# Patient Record
Sex: Male | Born: 1960 | Race: White | Hispanic: No | Marital: Married | Smoking: Never smoker
Health system: Southern US, Community
[De-identification: ages and names within clinical notes are randomized; demographics above are authoritative.]

## PROBLEM LIST (undated history)

## (undated) DIAGNOSIS — K219 Gastro-esophageal reflux disease without esophagitis: Secondary | ICD-10-CM

## (undated) DIAGNOSIS — J302 Other seasonal allergic rhinitis: Secondary | ICD-10-CM

## (undated) DIAGNOSIS — IMO0001 Reserved for inherently not codable concepts without codable children: Secondary | ICD-10-CM

## (undated) DIAGNOSIS — I10 Essential (primary) hypertension: Secondary | ICD-10-CM

## (undated) DIAGNOSIS — Z86010 Personal history of colonic polyps: Secondary | ICD-10-CM

## (undated) HISTORY — DX: Reserved for inherently not codable concepts without codable children: IMO0001

## (undated) HISTORY — PX: TONSILLECTOMY: SUR1361

## (undated) HISTORY — DX: Essential (primary) hypertension: I10

## (undated) HISTORY — DX: Gastro-esophageal reflux disease without esophagitis: K21.9

---

## 2009-04-29 DIAGNOSIS — Z860100 Personal history of colon polyps, unspecified: Secondary | ICD-10-CM

## 2009-04-29 HISTORY — DX: Personal history of colon polyps, unspecified: Z86.0100

## 2009-04-29 HISTORY — DX: Personal history of colonic polyps: Z86.010

## 2009-08-24 ENCOUNTER — Encounter: Payer: Self-pay | Admitting: Internal Medicine

## 2009-09-18 ENCOUNTER — Ambulatory Visit (HOSPITAL_COMMUNITY): Admission: RE | Admit: 2009-09-18 | Discharge: 2009-09-18 | Payer: Self-pay | Admitting: Internal Medicine

## 2009-09-18 ENCOUNTER — Ambulatory Visit: Payer: Self-pay | Admitting: Internal Medicine

## 2009-09-20 ENCOUNTER — Encounter: Payer: Self-pay | Admitting: Internal Medicine

## 2010-05-29 NOTE — Letter (Signed)
Summary: Patient Notice, Colon Biopsy Results  [facility]  [address]   [place], [place] [address]   Phone: [phone]  Fax: [phone]       Sep 20, 2009   Raymond Rosales [address] [place], [place]  [address] Dec 10, 1960    Dear [name],  I am pleased to inform you that the biopsies taken during your recent colonoscopy did not show any evidence of cancer upon pathologic examination.  Additional information/recommendations:  No further action is needed at this time.  Please follow-up with your primary care physician for your other healthcare needs.  You should have a repeat colonoscopy examination  in 3 years.  Please call us if you are having persistent problems or have questions about your condition that have not been fully answered at this time.  Sincerely,    R. Roetta Sessions MD, FACP [facility] Ph: [phone]    Fax: [phone]   Appended Document: Patient Notice, Colon Biopsy Results mailed letter to pt  Appended Document: Patient Notice, Colon Biopsy Results reminder in computer

## 2010-05-29 NOTE — Letter (Signed)
Summary: Internal Other Domingo Dimes  Internal Other Domingo Dimes   Imported By: Cloria Spring LPN 47/42/5956 38:75:64  _____________________________________________________________________  External Attachment:    Type:   Image     Comment:   External Document

## 2012-05-07 ENCOUNTER — Other Ambulatory Visit: Payer: Self-pay

## 2012-05-07 ENCOUNTER — Telehealth: Payer: Self-pay | Admitting: Internal Medicine

## 2012-05-07 DIAGNOSIS — Z139 Encounter for screening, unspecified: Secondary | ICD-10-CM

## 2012-05-07 NOTE — Telephone Encounter (Signed)
Pt came to office window this morning saying that he was due for his TCS and Dr Gerda Diss told him to get with [place]. I told patient that the recall list has him due in June 2014 and since he had polyps at the last tcs that we would need for him to make OV first and then schedule procedure. Patient does not feel it is necessary for an OV and he would like to set up procedure from triage. Please advise. [phone]

## 2012-05-07 NOTE — Telephone Encounter (Signed)
I called and spoke to the patient. He had his annual physical this morning and was just following up on his next colonoscopy, which is due 09/18/2012. ( Hx of tubulovillous adenoma). He would like to schedule in May but would like to avoid an office visit.  I told him I would ask Dr. Jena Gauss and let him know. He said he is not having any problems and he would definitely call if he had any problems before then. Please advise if he can just be triaged at that time.

## 2012-05-07 NOTE — Telephone Encounter (Signed)
I will go along with this patient to be triaged

## 2012-05-07 NOTE — Telephone Encounter (Signed)
LMOM at pt's cell  [phone], that Dr. Jena Gauss said he can just be triaged and I will have him on my call list to call in April and schedule for May as he requested.

## 2012-09-01 ENCOUNTER — Telehealth: Payer: Self-pay

## 2012-09-02 NOTE — Telephone Encounter (Signed)
Gastroenterology Pre-Procedure Form  THIS ONE DR. Jena Gauss HAD PREVIOUSLY GIVEN PERMISSION TO TRIAGE ( HX OF ADENOMATOUS POLYP) LAST 09/18/2009    Request Date: 09/02/2012   Requesting Physician: Time for next one (PCP is Dr. Lilyan Punt )     PATIENT INFORMATION:  Raymond Rosales is a 52 y.o., male (DOB=10/19/1960).  PROCEDURE: Procedure(s) requested: colonoscopy Procedure Reason: screening for colon cancer (Hx of adenomatous polyp )  PATIENT REVIEW QUESTIONS: The patient reports the following:   1. Diabetes Melitis: no 2. Joint replacements in the past 12 months: no 3. Major health problems in the past 3 months: no 4. Has an artificial valve or MVP:no 5. Has been advised in past to take antibiotics in advance of a procedure like teeth cleaning: no}    MEDICATIONS & ALLERGIES:    Patient reports the following regarding taking any blood thinners:   Plavix? no Aspirin?no Coumadin?  no  Patient confirms/reports the following medications:  Current Outpatient Prescriptions  Medication Sig Dispense Refill  . cetirizine HCl (ZYRTEC) 5 MG/5ML SYRP Take by mouth daily. He takes a dose of the liquid as needed, most every day ( he gives his daughter the liquid and has that on hand)      . Multiple Vitamin (MULTIVITAMIN) tablet Take 1 tablet by mouth daily.       No current facility-administered medications for this visit.    Patient confirms/reports the following allergies:  Allergies  Allergen Reactions  . Penicillins Rash    Patient is appropriate to schedule for requested procedure(s): yes  AUTHORIZATION INFORMATION Primary Insurance:   ID #:   Group #:  Pre-Cert / Auth required:  Pre-Cert / Auth #:   Secondary Insurance:   ID #:   Group #:  Pre-Cert / Auth required:  Pre-Cert / Auth #:   No orders of the defined types were placed in this encounter.    SCHEDULE INFORMATION: Procedure has been scheduled as follows:  Date: 09/22/2012   Time: 7:30 AM  Location: [facility] Short Stay  This Gastroenterology Pre-Precedure Form is being routed to the following provider(s) for review: R. Roetta Sessions, MD

## 2012-09-02 NOTE — Telephone Encounter (Signed)
OK to schedule

## 2012-09-08 MED ORDER — PEG-KCL-NACL-NASULF-NA ASC-C 100 G PO SOLR: 1.0000 | ORAL | Status: DC

## 2012-09-08 NOTE — Telephone Encounter (Signed)
Rx sent to the pharmacy and instructions mailed to the pt.  

## 2012-09-09 ENCOUNTER — Encounter (HOSPITAL_COMMUNITY): Payer: Self-pay | Admitting: Pharmacy Technician

## 2012-09-22 ENCOUNTER — Ambulatory Visit (HOSPITAL_COMMUNITY)
Admission: RE | Admit: 2012-09-22 | Discharge: 2012-09-22 | Disposition: A | Discharge status: Home / Self Care | Payer: BC Managed Care – PPO | Source: Ambulatory Visit | Attending: Internal Medicine | Admitting: Internal Medicine

## 2012-09-22 ENCOUNTER — Encounter (HOSPITAL_COMMUNITY): Payer: Self-pay | Admitting: *Deleted

## 2012-09-22 ENCOUNTER — Encounter (HOSPITAL_COMMUNITY)
Admission: RE | Disposition: A | Discharge status: Home / Self Care | Payer: Self-pay | Source: Ambulatory Visit | Attending: Internal Medicine

## 2012-09-22 DIAGNOSIS — K5730 Diverticulosis of large intestine without perforation or abscess without bleeding: Secondary | ICD-10-CM

## 2012-09-22 DIAGNOSIS — Z860100 Personal history of colon polyps, unspecified: Secondary | ICD-10-CM | POA: Insufficient documentation

## 2012-09-22 DIAGNOSIS — Z09 Encounter for follow-up examination after completed treatment for conditions other than malignant neoplasm: Secondary | ICD-10-CM | POA: Insufficient documentation

## 2012-09-22 DIAGNOSIS — Z1211 Encounter for screening for malignant neoplasm of colon: Secondary | ICD-10-CM

## 2012-09-22 HISTORY — DX: Other seasonal allergic rhinitis: J30.2

## 2012-09-22 HISTORY — PX: COLONOSCOPY: SHX5424

## 2012-09-22 HISTORY — DX: Personal history of colonic polyps: Z86.010

## 2012-09-22 SURGERY — COLONOSCOPY
Anesthesia: Moderate Sedation

## 2012-09-22 MED ORDER — MEPERIDINE HCL 100 MG/ML IJ SOLN
INTRAMUSCULAR | Status: DC | PRN
  Administered 2012-09-22 (×2): 50 mg via INTRAVENOUS

## 2012-09-22 MED ORDER — MIDAZOLAM HCL 5 MG/5ML IJ SOLN
INTRAMUSCULAR | Status: AC
  Filled 2012-09-22: qty 10

## 2012-09-22 MED ORDER — ONDANSETRON HCL 4 MG/2ML IJ SOLN
INTRAMUSCULAR | Status: AC
  Filled 2012-09-22: qty 2

## 2012-09-22 MED ORDER — MIDAZOLAM HCL 5 MG/5ML IJ SOLN
INTRAMUSCULAR | Status: DC | PRN
  Administered 2012-09-22: 2 mg via INTRAVENOUS
  Administered 2012-09-22: 1 mg via INTRAVENOUS
  Administered 2012-09-22: 2 mg via INTRAVENOUS

## 2012-09-22 MED ORDER — MEPERIDINE HCL 100 MG/ML IJ SOLN
INTRAMUSCULAR | Status: AC
  Filled 2012-09-22: qty 1

## 2012-09-22 MED ORDER — SODIUM CHLORIDE 0.9 % IV SOLN
INTRAVENOUS | Status: DC
  Administered 2012-09-22: 1000 mL via INTRAVENOUS

## 2012-09-22 MED ORDER — STERILE WATER FOR IRRIGATION IR SOLN
Status: DC | PRN
  Administered 2012-09-22: 08:00:00

## 2012-09-22 NOTE — H&P (Signed)
  Primary Care Physician:  Dr. Lilyan Punt Primary Gastroenterologist:  Dr. Jena Gauss  Pre-Procedure History & Physical: HPI:  Raymond Rosales is a 52 y.o. male here for here for surveillance colonoscopy. 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   No bowel symptoms currently  Past Medical History  Diagnosis Date  . Seasonal allergies   . History of colon polyps 2011    Past Surgical History  Procedure Laterality Date  . Tonsillectomy      52  yrs of age    Prior to Admission medications   Medication Sig Start Date End Date Taking? Authorizing Provider  cetirizine HCl (ZYRTEC) 5 MG/5ML SYRP Take 5 mg by mouth daily.   Yes Historical Provider, MD  Multiple Vitamin (MULTIVITAMIN) tablet Take 1 tablet by mouth daily.   Yes Historical Provider, MD  peg 3350 powder (MOVIPREP) 100 G SOLR Take 1 kit (100 g total) by mouth as directed. 09/08/12  Yes Corbin Ade, MD    Allergies as of 05/07/2012  . (Not on File)    Family History  Problem Relation Age of Onset  . CAD Father   . Heart attack Father   . Diabetes type II Father     History   Social History  . Marital Status: Married    Spouse Name: N/A    Number of Children: N/A  . Years of Education: N/A   Occupational History  . Not on file.   Social History Main Topics  . Smoking status: Never Smoker   . Smokeless tobacco: Not on file  . Alcohol Use: Yes     Comment: occasional social  . Drug Use: No  . Sexually Active: Yes   Other Topics Concern  . Not on file   Social History Narrative  . No narrative on file    Review of Systems: See HPI, otherwise negative ROS  Physical Exam: BP 139/87  Pulse 63  Temp(Src) 97.7 F (36.5 C) (Oral)  Resp 18  Ht 5\' 9"  (1.753 m)  Wt 200 lb (90.719 kg)  BMI 29.52 kg/m2  SpO2 98% General:   Alert,  Well-developed, well-nourished, pleasant and cooperative in NAD Skin:  Intact without significant lesions or rashes. Eyes:  Sclera clear, no icterus.    Conjunctiva pink. Ears:  Normal auditory acuity. Nose:  No deformity, discharge,  or lesions. Mouth:  No deformity or lesions. Neck:  Supple; no masses or thyromegaly. No significant cervical adenopathy. Lungs:  Clear throughout to auscultation.   No wheezes, crackles, or rhonchi. No acute distress. Heart:  Regular rate and rhythm; no murmurs, clicks, rubs,  or gallops. Abdomen: Non-distended, normal bowel sounds.  Soft and nontender without appreciable mass or hepatosplenomegaly.  Pulses:  Normal pulses noted. Extremities:  Without clubbing or edema.  Impression/Plan:  52 year old gentleman with a history of tubulovillous adenoma. Here for surveillance examination.  The risks, benefits, limitations, alternatives and imponderables have been reviewed with the patient. Questions have been answered. All parties are agreeable.

## 2012-09-22 NOTE — Op Note (Signed)
[facility] [address] [place], [address]   COLONOSCOPY PROCEDURE REPORT  PATIENT: Raymond Rosales, Raymond Rosales  MR#:         [id number] BIRTHDATE: 11-Nov-1960 , 52  yrs. old GENDER: Male ENDOSCOPIST: R.  Roetta Sessions, MD FACP FACG REFERRED BY:  Lilyan Punt, M.D. PROCEDURE DATE:  09/22/2012 PROCEDURE:     Surveillance colonoscopy  INDICATIONS: History of a tubulovillous adenoma removed from ascending colon 3 years ago  INFORMED CONSENT:  The risks, benefits, alternatives and imponderables including but not limited to bleeding, perforation as well as the possibility of a missed lesion have been reviewed.  The potential for biopsy, lesion removal, etc. have also been discussed.  Questions have been answered.  All parties agreeable. Please see the history and physical in the medical record for more information.  MEDICATIONS: Versed 6 mg IV And Demerol 100 mg IV in divided doses. Zofran 4 mg IV  DESCRIPTION OF PROCEDURE:  After a digital rectal exam was performed, the EC-[id number] ([id number])  colonoscope was advanced from the anus through the rectum and colon to the area of the cecum, ileocecal valve and appendiceal orifice.  The cecum was deeply intubated.  These structures were well-seen and photographed for the record.  From the level of the cecum and ileocecal valve, the scope was slowly and cautiously withdrawn.  The mucosal surfaces were carefully surveyed utilizing scope tip deflection to facilitate fold flattening as needed.  The scope was pulled down into the rectum where a thorough examination including retroflexion was performed.    FINDINGS:  Adequate preparation. Normal rectum. Few,scattered left-sided diverticula; the remainder of the colonic mucosa appeared normal.  THERAPEUTIC / DIAGNOSTIC MANEUVERS PERFORMED:  None  COMPLICATIONS: None  CECAL WITHDRAWAL TIME:  11 minutes  IMPRESSION:  Colonic diverticulosis  RECOMMENDATIONS: Repeat colonoscopy  in 5 years   _______________________________ eSigned:  R. Roetta Sessions, MD FACP [facility] 09/22/2012 8:24 AM   CC:

## 2012-09-23 ENCOUNTER — Encounter (HOSPITAL_COMMUNITY): Payer: Self-pay | Admitting: Internal Medicine

## 2012-10-21 ENCOUNTER — Ambulatory Visit (INDEPENDENT_AMBULATORY_CARE_PROVIDER_SITE_OTHER): Payer: BC Managed Care – PPO | Admitting: Family Medicine

## 2012-10-21 ENCOUNTER — Encounter: Payer: Self-pay | Admitting: Family Medicine

## 2012-10-21 VITALS — BP 140/80 | Temp 98.5°F | Ht 68.5 in | Wt 210.2 lb

## 2012-10-21 DIAGNOSIS — I10 Essential (primary) hypertension: Secondary | ICD-10-CM | POA: Insufficient documentation

## 2012-10-21 NOTE — Progress Notes (Signed)
  Subjective:    Patient ID: Raymond Rosales, male    DOB: December 28, 1960, 52 y.o.   MRN: [id number]  Hypertension This is a new problem. The current episode started in the past 7 days. The problem is unchanged. The problem is uncontrolled. Associated symptoms include headaches. (Jaw tingling on left side only ) There are no associated agents to hypertension. There are no known risk factors for coronary artery disease. Past treatments include nothing. The current treatment provides no improvement. There are no compliance problems.    No prior history of blood pressure troubles her is some family history of heart disease and diabetes Patient had a little bit of numbness on the left side of his jaw but this only lasted a few minutes and went away he states he's had this intermittently for a long time  Review of Systems  Neurological: Positive for headaches.   Blood pressure was checked his machine versus our wall unit. The lower number is accurate the top number of his machine over reads it.    Objective:   Physical Exam Lungs are clear no crackles respiratory rate normal Heart regular no murmurs Extremities no edema skin warm dry Blood pressure best reading 138/94       Assessment & Plan:  HTN-I. encourage patient to do the best he can exercise watch diet use DASH diet and lose 20 pounds. Also encourage patient to followup in 3 months time we'll see a blood pressure is doing he will check his blood pressure no greater than twice a week he will notify us if any particular problems. I find no evidence of stroke I do recommend cholesterol profile metabolic 7 before next visit in 3 months

## 2012-10-21 NOTE — Patient Instructions (Signed)
Dash diet 1800 Calorie Diet for Diabetes Meal Planning The 1800 calorie diet is designed for eating up to 1800 calories each day. Following this diet and making healthy meal choices can help improve overall health. This diet controls blood sugar (glucose) levels and can also help lower blood pressure and cholesterol. SERVING SIZES Measuring foods and serving sizes helps to make sure you are getting the right amount of food. The list below tells how big or small some common serving sizes are:  1 oz.........4 stacked dice.  3 oz........Marland KitchenDeck of cards.  1 tsp.......Marland KitchenTip of little finger.  1 tbs......Marland KitchenMarland KitchenThumb.  2 tbs.......Marland KitchenGolf ball.   cup......Marland KitchenHalf of a fist.  1 cup.......Marland KitchenA fist. GUIDELINES FOR CHOOSING FOODS The goal of this diet is to eat a variety of foods and limit calories to 1800 each day. This can be done by choosing foods that are low in calories and fat. The diet also suggests eating small amounts of food frequently. Doing this helps control your blood glucose levels so they do not get too high or too low. Each meal or snack may include a protein food source to help you feel more satisfied and to stabilize your blood glucose. Try to eat about the same amount of food around the same time each day. This includes weekend days, travel days, and days off work. Space your meals about 4 to 5 hours apart and add a snack between them if you wish.  For example, a daily food plan could include breakfast, a morning snack, lunch, dinner, and an evening snack. Healthy meals and snacks include whole grains, vegetables, fruits, lean meats, poultry, fish, and dairy products. As you plan your meals, select a variety of foods. Choose from the bread and starch, vegetable, fruit, dairy, and meat/protein groups. Examples of foods from each group and their suggested serving sizes are listed below. Use measuring cups and spoons to become familiar with what a healthy portion looks like. Bread and Starch Each  serving equals 15 grams of carbohydrates.  1 slice bread.   bagel.   cup cold cereal (unsweetened).   cup hot cereal or mashed potatoes.  1 small potato (size of a computer mouse).   cup cooked pasta or rice.   English muffin.  1 cup broth-based soup.  3 cups of popcorn.  4 to 6 whole-wheat crackers.   cup cooked beans, peas, or corn. Vegetable Each serving equals 5 grams of carbohydrates.   cup cooked vegetables.  1 cup raw vegetables.   cup tomato or vegetable juice. Fruit Each serving equals 15 grams of carbohydrates.  1 small apple or orange.  1 cup watermelon or strawberries.   cup applesauce (no sugar added).  2 tbs raisins.   banana.   cup canned fruit, packed in water, its own juice, or sweetened with a sugar substitute.   cup unsweetened fruit juice. Dairy Each serving equals 12 to 15 grams of carbohydrates.  1 cup fat-free milk.  6 oz artificially sweetened yogurt or plain yogurt.  1 cup low-fat buttermilk.  1 cup soy milk.  1 cup almond milk. Meat/Protein  1 large egg.  2 to 3 oz meat, poultry, or fish.   cup low-fat cottage cheese.  1 tbs peanut butter.  1 oz low-fat cheese.   cup tuna in water.   cup tofu. Fat  1 tsp oil.  1 tsp trans-fat-free margarine.  1 tsp butter.  1 tsp mayonnaise.  2 tbs avocado.  1 tbs salad dressing.  1 tbs  cream cheese.  2 tbs sour cream. SAMPLE 1800 CALORIE DIET PLAN Breakfast   cup unsweetened cereal (1 carb serving).  1 cup fat-free milk (1 carb serving).  1 slice whole-wheat toast (1 carb serving).   small banana (1 carb serving).  1 scrambled egg.  1 tsp trans-fat-free margarine. Lunch  Tuna sandwich.  2 slices whole-wheat bread (2 carb servings).   cup canned tuna in water, drained.  1 tbs reduced fat mayonnaise.  1 stalk celery, chopped.  2 slices tomato.  1 lettuce leaf.  1 cup carrot sticks.  24 to 30 seedless grapes (2 carb  servings).  6 oz light yogurt (1 carb serving). Afternoon Snack  3 graham cracker squares (1 carb serving).  Fat-free milk, 1 cup (1 carb serving).  1 tbs peanut butter. Dinner  3 oz salmon, broiled with 1 tsp oil.  1 cup mashed potatoes (2 carb servings) with 1 tsp trans-fat-free margarine.  1 cup fresh or frozen green beans.  1 cup steamed asparagus.  1 cup fat-free milk (1 carb serving). Evening Snack  3 cups air-popped popcorn (1 carb serving).  2 tbs parmesan cheese sprinkled on top. MEAL PLAN Use this worksheet to help you make a daily meal plan based on the 1800 calorie diet suggestions. If you are using this plan to help you control your blood glucose, you may interchange carbohydrate-containing foods (dairy, starches, and fruits). Select a variety of fresh foods of varying colors and flavors. The total amount of carbohydrate in your meals or snacks is more important than making sure you include all of the food groups every time you eat. Choose from the following foods to build your day's meals:  8 Starches.  4 Vegetables.  3 Fruits.  2 Dairy.  6 to 7 oz Meat/Protein.  Up to 4 Fats. Your dietician can use this worksheet to help you decide how many servings and which types of foods are right for you. BREAKFAST Food Group and Servings / Food Choice Starch ________________________________________________________ Dairy _________________________________________________________ Fruit _________________________________________________________ Meat/Protein __________________________________________________ Fat ___________________________________________________________ LUNCH Food Group and Servings / Food Choice Starch ________________________________________________________ Meat/Protein __________________________________________________ Vegetable _____________________________________________________ Fruit  _________________________________________________________ Dairy _________________________________________________________ Fat ___________________________________________________________ Aura Fey Food Group and Servings / Food Choice Starch ________________________________________________________ Meat/Protein __________________________________________________ Fruit __________________________________________________________ Dairy _________________________________________________________ Laural Golden Food Group and Servings / Food Choice Starch _________________________________________________________ Meat/Protein ___________________________________________________ Dairy __________________________________________________________ Vegetable ______________________________________________________ Fruit ___________________________________________________________ Fat ____________________________________________________________ Lollie Sails Food Group and Servings / Food Choice Fruit __________________________________________________________ Meat/Protein ___________________________________________________ Dairy __________________________________________________________ Starch _________________________________________________________ DAILY TOTALS Starch ____________________________ Vegetable _________________________ Fruit _____________________________ Dairy _____________________________ Meat/Protein______________________ Fat _______________________________ Document Released: 11/05/2004 Document Revised: 07/08/2011 Document Reviewed: 03/01/2011 [employer] Patient Information 2014 [employer] DASH Diet The DASH diet stands for "Dietary Approaches to Stop Hypertension." It is a healthy eating plan that has been shown to reduce high blood pressure (hypertension) in as little as 14 days, while also possibly providing other significant health benefits. These other health benefits include  reducing the risk of breast cancer after menopause and reducing the risk of type 2 diabetes, heart disease, colon cancer, and stroke. Health benefits also include weight loss and slowing kidney failure in patients with chronic kidney disease.  DIET GUIDELINES  Limit salt (sodium). Your diet should contain less than 1500 mg of sodium daily.  Limit refined or processed carbohydrates. Your diet should include mostly whole grains. Desserts and added sugars should be used sparingly.  Include small amounts of heart-healthy fats. These types of fats include nuts, oils, and tub margarine. Limit saturated  and trans fats. These fats have been shown to be harmful in the body. CHOOSING FOODS  The following food groups are based on a 2000 calorie diet. See your Registered Dietitian for individual calorie needs. Grains and Grain Products (6 to 8 servings daily)  Eat More Often: Whole-wheat bread, brown rice, whole-grain or wheat pasta, quinoa, popcorn without added fat or salt (air popped).  Eat Less Often: White bread, white pasta, white rice, cornbread. Vegetables (4 to 5 servings daily)  Eat More Often: Fresh, frozen, and canned vegetables. Vegetables may be raw, steamed, roasted, or grilled with a minimal amount of fat.  Eat Less Often/Avoid: Creamed or fried vegetables. Vegetables in a cheese sauce. Fruit (4 to 5 servings daily)  Eat More Often: All fresh, canned (in natural juice), or frozen fruits. Dried fruits without added sugar. One hundred percent fruit juice ( cup [237 mL] daily).  Eat Less Often: Dried fruits with added sugar. Canned fruit in light or heavy syrup. [employer], Fish, and Poultry (2 servings or less daily. One serving is 3 to 4 oz [85-114 g]).  Eat More Often: Ninety percent or leaner ground beef, tenderloin, sirloin. Round cuts of beef, chicken breast, [place] breast. All fish. Grill, bake, or broil your meat. Nothing should be fried.  Eat Less Often/Avoid: Fatty cuts of  meat, [place], or chicken leg, thigh, or wing. Fried cuts of meat or fish. Dairy (2 to 3 servings)  Eat More Often: Low-fat or fat-free milk, low-fat plain or light yogurt, reduced-fat or part-skim cheese.  Eat Less Often/Avoid: Milk (whole, 2%).Whole milk yogurt. Full-fat cheeses. Nuts, Seeds, and Legumes (4 to 5 servings per week)  Eat More Often: All without added salt.  Eat Less Often/Avoid: Salted nuts and seeds, canned beans with added salt. Fats and Sweets (limited)  Eat More Often: Vegetable oils, tub margarines without trans fats, sugar-free gelatin. Mayonnaise and salad dressings.  Eat Less Often/Avoid: Coconut oils, palm oils, butter, stick margarine, cream, half and half, cookies, candy, pie. FOR MORE INFORMATION The Dash Diet Eating Plan: www.dashdiet.org Document Released: 04/04/2011 Document Revised: 07/08/2011 Document Reviewed: 04/04/2011 [facility] Patient Information 2014 [place], [place].

## 2012-10-26 ENCOUNTER — Encounter: Payer: Self-pay | Admitting: *Deleted

## 2013-02-19 ENCOUNTER — Telehealth: Payer: Self-pay | Admitting: Family Medicine

## 2013-02-19 MED ORDER — AZITHROMYCIN 250 MG PO TABS: ORAL_TABLET | ORAL | Status: AC

## 2013-02-19 NOTE — Telephone Encounter (Signed)
z pk. Let pt know we usually do not call in abx but will this time

## 2013-02-19 NOTE — Telephone Encounter (Signed)
Notified patient that zpak was sent to pharmacy.

## 2013-02-19 NOTE — Telephone Encounter (Signed)
Patient wife was in earlier for sickness and patient is experiencing sinus drainage, slight sore throat, tiredness. Would like something called in to [facility].

## 2013-05-24 ENCOUNTER — Ambulatory Visit (INDEPENDENT_AMBULATORY_CARE_PROVIDER_SITE_OTHER): Payer: BC Managed Care – PPO | Admitting: Family Medicine

## 2013-05-24 ENCOUNTER — Encounter: Payer: Self-pay | Admitting: Family Medicine

## 2013-05-24 VITALS — BP 134/84 | Temp 98.5°F | Ht 69.0 in | Wt 209.0 lb

## 2013-05-24 DIAGNOSIS — J069 Acute upper respiratory infection, unspecified: Secondary | ICD-10-CM

## 2013-05-24 MED ORDER — AMPHETAMINE-DEXTROAMPHET ER 10 MG PO CP24: 10.0000 mg | ORAL_CAPSULE | Freq: Every day | ORAL | Status: DC

## 2013-05-24 MED ORDER — BENZONATATE 100 MG PO CAPS: 100.0000 mg | ORAL_CAPSULE | Freq: Four times a day (QID) | ORAL | Status: DC | PRN

## 2013-05-24 MED ORDER — AZITHROMYCIN 250 MG PO TABS: ORAL_TABLET | ORAL | Status: DC

## 2013-05-24 NOTE — Progress Notes (Signed)
   Subjective:    Patient ID: Raymond Rosales, male    DOB: 1960-12-17, 53 y.o.   MRN: [id number]  Cough This is a new problem. The current episode started in the past 7 days. Associated symptoms include myalgias and wheezing. Associated symptoms comments: Runny nose. Treatments tried: delsym, ibuprofen.   PMH benign   Review of Systems  Respiratory: Positive for cough and wheezing.   Musculoskeletal: Positive for myalgias.   no vomiting or nausea     Objective:   Physical Exam  Lungs are clear hearts regular upper respiratory eardrums normal throat is normal sinus nontender      Assessment & Plan:  Viral upper illness doubt any type of bacterial process currently antibiotics were given to him in case he gets worse he can get filled otherwise try Levaquin as course of the next few days

## 2014-01-19 ENCOUNTER — Telehealth: Payer: Self-pay | Admitting: *Deleted

## 2014-01-19 ENCOUNTER — Ambulatory Visit (INDEPENDENT_AMBULATORY_CARE_PROVIDER_SITE_OTHER): Payer: BC Managed Care – PPO | Admitting: Family Medicine

## 2014-01-19 ENCOUNTER — Ambulatory Visit (HOSPITAL_COMMUNITY)
Admission: RE | Admit: 2014-01-19 | Discharge: 2014-01-19 | Disposition: A | Discharge status: Home / Self Care | Payer: BC Managed Care – PPO | Source: Ambulatory Visit | Attending: Family Medicine | Admitting: Family Medicine

## 2014-01-19 ENCOUNTER — Encounter: Payer: Self-pay | Admitting: Family Medicine

## 2014-01-19 VITALS — BP 122/80 | Ht 69.0 in | Wt 210.0 lb

## 2014-01-19 DIAGNOSIS — M7989 Other specified soft tissue disorders: Secondary | ICD-10-CM | POA: Insufficient documentation

## 2014-01-19 LAB — D-DIMER, QUANTITATIVE (NOT AT ARMC): D DIMER QUANT: 0.23 ug{FEU}/mL (ref 0.00–0.48)

## 2014-01-19 NOTE — Telephone Encounter (Signed)
The patient was told of the results by the nurse as well as I called the patient and discuss them. Most likely venous insufficiency.

## 2014-01-19 NOTE — Progress Notes (Signed)
   Subjective:    Patient ID: Raymond Rosales, male    DOB: Sep 17, 1960, 53 y.o.   MRN: [id number]  Leg Pain  The incident occurred more than 1 week ago. There was no injury mechanism. The pain is present in the right leg. The pain is mild. The pain has been intermittent since onset. Associated symptoms include tingling. He reports no foreign bodies present. Nothing aggravates the symptoms. He has tried nothing for the symptoms. The treatment provided no relief.   Patient states that he has no other concerns at this time.   PMH benign Review of Systems  Neurological: Positive for tingling.       Objective:   Physical Exam Lungs clear heart regular right leg there is some mild edema it is approximately half inch in greater diameter on the right side than the left side. No severe tenderness.       Assessment & Plan:  Probable venous insufficiency of the leg. Ultrasound was negative for DVT. D-dimer came back negative. May use knee-high compression hose if desires. I do recommend followup this fall for a comprehensive checkup and lab work

## 2014-01-19 NOTE — Telephone Encounter (Signed)
Results of stat ultrasound and labs ready

## 2014-02-08 ENCOUNTER — Telehealth: Payer: Self-pay | Admitting: Family Medicine

## 2014-02-08 ENCOUNTER — Ambulatory Visit (INDEPENDENT_AMBULATORY_CARE_PROVIDER_SITE_OTHER): Payer: BC Managed Care – PPO | Admitting: Family Medicine

## 2014-02-08 ENCOUNTER — Encounter: Payer: Self-pay | Admitting: Family Medicine

## 2014-02-08 VITALS — BP 150/104 | Ht 69.0 in | Wt 209.0 lb

## 2014-02-08 DIAGNOSIS — R030 Elevated blood-pressure reading, without diagnosis of hypertension: Secondary | ICD-10-CM

## 2014-02-08 DIAGNOSIS — IMO0001 Reserved for inherently not codable concepts without codable children: Secondary | ICD-10-CM

## 2014-02-08 NOTE — Telephone Encounter (Signed)
Low salt diet and walking recommended. If he is having headache then he needs to be seen today. Cut down on caffiene. Bring BP cuff with him if this a home unit. Schedule for Friday am ( 9 preferred) If cant do that then Thursday afternoon or tonight.

## 2014-02-08 NOTE — Progress Notes (Signed)
   Subjective:    Patient ID: Raymond Rosales, male    DOB: Feb 03, 1961, 53 y.o.   MRN: [id number]  Hypertension The current episode started today. Associated symptoms include anxiety. Pertinent negatives include no chest pain.   Patient states he has a lot going on @ work. 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   Patient went chiropractor & was told he was tight & needed an adjustment has another visit on tomorrow. While there BP was 153/100(LA) & @ 200/100 (RA)   Review of Systems  Constitutional: Negative for activity change, appetite change and fatigue.  HENT: Negative for congestion.   Respiratory: Negative for cough.   Cardiovascular: Negative for chest pain.  Gastrointestinal: Negative for abdominal pain.  Endocrine: Negative for polydipsia and polyphagia.  Neurological: Negative for weakness.  Psychiatric/Behavioral: Negative for confusion.       Objective:   Physical Exam  Vitals reviewed. Constitutional: He appears well-nourished. No distress.  Cardiovascular: Normal rate, regular rhythm and normal heart sounds.   No murmur heard. Pulmonary/Chest: Effort normal and breath sounds normal. No respiratory distress.  Musculoskeletal: He exhibits no edema.  Lymphadenopathy:    He has no cervical adenopathy.  Neurological: He is alert.  Psychiatric: His behavior is normal.    The blood pressure was checked twice right arm 138/92 left arm 134/92      Assessment & Plan:  Elevated blood pressure-healthy diet regular physical activity followup again in a few weeks if still elevated start medication. Patient will be doing his lab work later this year as well as wellness exam he was counseled strongly to start exercising and watching diet. Avoid decongestants.

## 2014-02-08 NOTE — Patient Instructions (Addendum)
DASH Eating Plan DASH stands for "Dietary Approaches to Stop Hypertension." The DASH eating plan is a healthy eating plan that has been shown to reduce high blood pressure (hypertension). Additional health benefits may include reducing the risk of type 2 diabetes mellitus, heart disease, and stroke. The DASH eating plan may also help with weight loss. WHAT DO I NEED TO KNOW ABOUT THE DASH EATING PLAN? For the DASH eating plan, you will follow these general guidelines:  Choose foods with a percent daily value for sodium of less than 5% (as listed on the food label).  Use salt-free seasonings or herbs instead of table salt or sea salt.  Check with your health care provider or pharmacist before using salt substitutes.  Eat lower-sodium products, often labeled as "lower sodium" or "no salt added."  Eat fresh foods.  Eat more vegetables, fruits, and low-fat dairy products.  Choose whole grains. Look for the word "whole" as the first word in the ingredient list.  Choose fish and skinless chicken or turkey more often than red meat. Limit fish, poultry, and meat to 6 oz (170 g) each day.  Limit sweets, desserts, sugars, and sugary drinks.  Choose heart-healthy fats.  Limit cheese to 1 oz (28 g) per day.  Eat more home-cooked food and less restaurant, buffet, and fast food.  Limit fried foods.  Cook foods using methods other than frying.  Limit canned vegetables. If you do use them, rinse them well to decrease the sodium.  When eating at a restaurant, ask that your food be prepared with less salt, or no salt if possible. WHAT FOODS CAN I EAT? Seek help from a dietitian for individual calorie needs. Grains Whole grain or whole wheat bread. Brown rice. Whole grain or whole wheat pasta. Quinoa, bulgur, and whole grain cereals. Low-sodium cereals. Corn or whole wheat flour tortillas. Whole grain cornbread. Whole grain crackers. Low-sodium crackers. Vegetables Fresh or frozen vegetables  (raw, steamed, roasted, or grilled). Low-sodium or reduced-sodium tomato and vegetable juices. Low-sodium or reduced-sodium tomato sauce and paste. Low-sodium or reduced-sodium canned vegetables.  Fruits All fresh, canned (in natural juice), or frozen fruits. Meat and Other Protein Products Ground beef (85% or leaner), grass-fed beef, or beef trimmed of fat. Skinless chicken or turkey. Ground chicken or turkey. Pork trimmed of fat. All fish and seafood. Eggs. Dried beans, peas, or lentils. Unsalted nuts and seeds. Unsalted canned beans. Dairy Low-fat dairy products, such as skim or 1% milk, 2% or reduced-fat cheeses, low-fat ricotta or cottage cheese, or plain low-fat yogurt. Low-sodium or reduced-sodium cheeses. Fats and Oils Tub margarines without trans fats. Light or reduced-fat mayonnaise and salad dressings (reduced sodium). Avocado. Safflower, olive, or canola oils. Natural peanut or almond butter. Other Unsalted popcorn and pretzels. The items listed above may not be a complete list of recommended foods or beverages. Contact your dietitian for more options. WHAT FOODS ARE NOT RECOMMENDED? Grains White bread. White pasta. White rice. Refined cornbread. Bagels and croissants. Crackers that contain trans fat. Vegetables Creamed or fried vegetables. Vegetables in a cheese sauce. Regular canned vegetables. Regular canned tomato sauce and paste. Regular tomato and vegetable juices. Fruits Dried fruits. Canned fruit in light or heavy syrup. Fruit juice. Meat and Other Protein Products Fatty cuts of meat. Ribs, chicken wings, bacon, sausage, bologna, salami, chitterlings, fatback, hot dogs, bratwurst, and packaged luncheon meats. Salted nuts and seeds. Canned beans with salt. Dairy Whole or 2% milk, cream, half-and-half, and cream cheese. Whole-fat or sweetened yogurt. Full-fat   cheeses or blue cheese. Nondairy creamers and whipped toppings. Processed cheese, cheese spreads, or cheese  curds. Condiments Onion and garlic salt, seasoned salt, table salt, and sea salt. Canned and packaged gravies. Worcestershire sauce. Tartar sauce. Barbecue sauce. Teriyaki sauce. Soy sauce, including reduced sodium. Steak sauce. Fish sauce. Oyster sauce. Cocktail sauce. Horseradish. Ketchup and mustard. Meat flavorings and tenderizers. Bouillon cubes. Hot sauce. Tabasco sauce. Marinades. Taco seasonings. Relishes. Fats and Oils Butter, stick margarine, lard, shortening, ghee, and bacon fat. Coconut, palm kernel, or palm oils. Regular salad dressings. Other Pickles and olives. Salted popcorn and pretzels. The items listed above may not be a complete list of foods and beverages to avoid. Contact your dietitian for more information. WHERE CAN I FIND MORE INFORMATION? National Heart, Lung, and Blood Institute: travelstabloid.com Document Released: 04/04/2011 Document Revised: 08/30/2013 Document Reviewed: 02/17/2013 [facility] Patient Information 2015 [place], [place]. This information is not intended to    Hypertension Hypertension, commonly called high blood pressure, is when the force of blood pumping through your arteries is too strong. Your arteries are the blood vessels that carry blood from your heart throughout your body. A blood pressure reading consists of a higher number over a lower number, such as 110/72. The higher number (systolic) is the pressure inside your arteries when your heart pumps. The lower number (diastolic) is the pressure inside your arteries when your heart relaxes. Ideally you want your blood pressure below 120/80. Hypertension forces your heart to work harder to pump blood. Your arteries may become narrow or stiff. Having hypertension puts you at risk for heart disease, stroke, and other problems.  RISK FACTORS Some risk factors for high blood pressure are controllable. Others are not.  Risk factors you cannot control include:   Race. You  may be at higher risk if you are African American.  Age. Risk increases with age.  Gender. Men are at higher risk than women before age 71 years. After age 25, women are at higher risk than men. Risk factors you can control include:  Not getting enough exercise or physical activity.  Being overweight.  Getting too much fat, sugar, calories, or salt in your diet.  Drinking too much alcohol. SIGNS AND SYMPTOMS Hypertension does not usually cause signs or symptoms. Extremely high blood pressure (hypertensive crisis) may cause headache, anxiety, shortness of breath, and nosebleed. DIAGNOSIS  To check if you have hypertension, your health care provider will measure your blood pressure while you are seated, with your arm held at the level of your heart. It should be measured at least twice using the same arm. Certain conditions can cause a difference in blood pressure between your right and left arms. A blood pressure reading that is higher than normal on one occasion does not mean that you need treatment. If one blood pressure reading is high, ask your health care provider about having it checked again. TREATMENT  Treating high blood pressure includes making lifestyle changes and possibly taking medicine. Living a healthy lifestyle can help lower high blood pressure. You may need to change some of your habits. Lifestyle changes may include:  Following the DASH diet. This diet is high in fruits, vegetables, and whole grains. It is low in salt, red meat, and added sugars.  Getting at least 2 hours of brisk physical activity every week.  Losing weight if necessary.  Not smoking.  Limiting alcoholic beverages.  Learning ways to reduce stress. If lifestyle changes are not enough to get your blood pressure under  control, your health care provider may prescribe medicine. You may need to take more than one. Work closely with your health care provider to understand the risks and benefits. HOME  CARE INSTRUCTIONS  Have your blood pressure rechecked as directed by your health care provider.   Take medicines only as directed by your health care provider. Follow the directions carefully. Blood pressure medicines must be taken as prescribed. The medicine does not work as well when you skip doses. Skipping doses also puts you at risk for problems.   Do not smoke.   Monitor your blood pressure at home as directed by your health care provider. SEEK MEDICAL CARE IF:   You think you are having a reaction to medicines taken.  You have recurrent headaches or feel dizzy.  You have swelling in your ankles.  You have trouble with your vision. SEEK IMMEDIATE MEDICAL CARE IF:  You develop a severe headache or confusion.  You have unusual weakness, numbness, or feel faint.  You have severe chest or abdominal pain.  You vomit repeatedly.  You have trouble breathing. MAKE SURE YOU:   Understand these instructions.  Will watch your condition.  Will get help right away if you are not doing well or get worse. Document Released: 04/15/2005 Document Revised: 08/30/2013 Document Reviewed: 02/05/2013 [facility] Patient Information 2015 [place], [place]. This information is not intended to replace advice given to you by your health care provider. Make sure you discuss any questions you have with your health care provider. replace advice given to you by your health care provider. Make sure you discuss any questions you have with your health care provider.

## 2014-02-08 NOTE — Telephone Encounter (Signed)
Patient took blood pressure this morning and it was running 153/93. He states has never been on medication,do you recommend appointment,if so when can you work him in next available today is 4:30 the rest of the week same day.

## 2014-02-08 NOTE — Telephone Encounter (Signed)
Discussed with patient. Pt walked in and wanted to be seen today. Checked bp and it was 148/100. Pt states he feels flush in the face and wants to be seen today. appt given with Dr. Nicki Reaper this afternoon.

## 2014-02-08 NOTE — Telephone Encounter (Signed)
[facility] 02/08/14

## 2014-02-21 ENCOUNTER — Telehealth: Payer: Self-pay | Admitting: Family Medicine

## 2014-02-21 NOTE — Telephone Encounter (Signed)
Pt came in to say his Chiropractor ran some xrays on his back. He has C5 Degenerative Disc with Radialopathy   was told he would need something to get rid of the irritation other than the aleve he has been taking.   Call into [facility]

## 2014-02-22 MED ORDER — DICLOFENAC SODIUM 75 MG PO TBEC: 75.0000 mg | DELAYED_RELEASE_TABLET | Freq: Two times a day (BID) | ORAL | Status: DC

## 2014-02-22 NOTE — Telephone Encounter (Signed)
May use Voltaren 75 mg, 1 twice a day, #40, follow up if ongoing troubles

## 2014-02-22 NOTE — Telephone Encounter (Signed)
Rx sent electronically to pharmacy. Patient notified. 

## 2014-03-07 ENCOUNTER — Ambulatory Visit (INDEPENDENT_AMBULATORY_CARE_PROVIDER_SITE_OTHER): Payer: BC Managed Care – PPO | Admitting: Family Medicine

## 2014-03-07 ENCOUNTER — Encounter: Payer: Self-pay | Admitting: Family Medicine

## 2014-03-07 VITALS — BP 138/90 | Ht 69.0 in | Wt 203.5 lb

## 2014-03-07 DIAGNOSIS — R030 Elevated blood-pressure reading, without diagnosis of hypertension: Secondary | ICD-10-CM

## 2014-03-07 DIAGNOSIS — M489 Spondylopathy, unspecified: Secondary | ICD-10-CM

## 2014-03-07 DIAGNOSIS — M438X2 Other specified deforming dorsopathies, cervical region: Secondary | ICD-10-CM

## 2014-03-07 DIAGNOSIS — IMO0001 Reserved for inherently not codable concepts without codable children: Secondary | ICD-10-CM

## 2014-03-07 MED ORDER — DICLOFENAC SODIUM 75 MG PO TBEC: 75.0000 mg | DELAYED_RELEASE_TABLET | Freq: Two times a day (BID) | ORAL | Status: DC

## 2014-03-07 NOTE — Progress Notes (Signed)
   Subjective:    Patient ID: Raymond Rosales, male    DOB: [birth year], 53 y.o.   MRN: [id number]  Hypertension This is a new problem. The current episode started 1 to 4 weeks ago. The problem has been gradually improving since onset. Pertinent negatives include no chest pain. There are no associated agents to hypertension. There are no known risk factors for coronary artery disease. Past treatments include lifestyle changes. The current treatment provides moderate improvement. There are no compliance problems.    Patient is still having neck pain. Under the care of chiropractor currently   Review of Systems  Constitutional: Negative for activity change, appetite change and fatigue.  HENT: Negative for congestion.   Respiratory: Negative for cough.   Cardiovascular: Negative for chest pain.  Gastrointestinal: Negative for abdominal pain.  Endocrine: Negative for polydipsia and polyphagia.  Neurological: Negative for weakness.  Psychiatric/Behavioral: Negative for confusion.       Objective:   Physical Exam  Constitutional: He appears well-nourished. No distress.  Cardiovascular: Normal rate, regular rhythm and normal heart sounds.   No murmur heard. Pulmonary/Chest: Effort normal and breath sounds normal. No respiratory distress.  Musculoskeletal: He exhibits no edema.  Lymphadenopathy:    He has no cervical adenopathy.  Neurological: He is alert.  Psychiatric: His behavior is normal.  Vitals reviewed.         Assessment & Plan:  #1 HTN he has lost some ways watching his diet hopefully he'll get this under better control. Hold off on any medications currently. Follow-up again in January for his wellness. May need to be on medications at that time.  Does have degenerative spine condition and his neck causing some left trapezius pain patient was told that if he starts I am weakness or progressive troubles he may need MRI he will let us know currently under chiropractor care

## 2014-03-07 NOTE — Patient Instructions (Signed)
DASH Eating Plan °DASH stands for "Dietary Approaches to Stop Hypertension." The DASH eating plan is a healthy eating plan that has been shown to reduce high blood pressure (hypertension). Additional health benefits may include reducing the risk of type 2 diabetes mellitus, heart disease, and stroke. The DASH eating plan may also help with weight loss. °WHAT DO I NEED TO KNOW ABOUT THE DASH EATING PLAN? °For the DASH eating plan, you will follow these general guidelines: °· Choose foods with a percent daily value for sodium of less than 5% (as listed on the food label). °· Use salt-free seasonings or herbs instead of table salt or sea salt. °· Check with your health care provider or pharmacist before using salt substitutes. °· Eat lower-sodium products, often labeled as "lower sodium" or "no salt added." °· Eat fresh foods. °· Eat more vegetables, fruits, and low-fat dairy products. °· Choose whole grains. Look for the word "whole" as the first word in the ingredient list. °· Choose fish and skinless chicken or turkey more often than red meat. Limit fish, poultry, and meat to 6 oz (170 g) each day. °· Limit sweets, desserts, sugars, and sugary drinks. °· Choose heart-healthy fats. °· Limit cheese to 1 oz (28 g) per day. °· Eat more home-cooked food and less restaurant, buffet, and fast food. °· Limit fried foods. °· Cook foods using methods other than frying. °· Limit canned vegetables. If you do use them, rinse them well to decrease the sodium. °· When eating at a restaurant, ask that your food be prepared with less salt, or no salt if possible. °WHAT FOODS CAN I EAT? °Seek help from a dietitian for individual calorie needs. °Grains °Whole grain or whole wheat bread. Brown rice. Whole grain or whole wheat pasta. Quinoa, bulgur, and whole grain cereals. Low-sodium cereals. Corn or whole wheat flour tortillas. Whole grain cornbread. Whole grain crackers. Low-sodium crackers. °Vegetables °Fresh or frozen vegetables  (raw, steamed, roasted, or grilled). Low-sodium or reduced-sodium tomato and vegetable juices. Low-sodium or reduced-sodium tomato sauce and paste. Low-sodium or reduced-sodium canned vegetables.  °Fruits °All fresh, canned (in natural juice), or frozen fruits. °Meat and Other Protein Products °Ground beef (85% or leaner), grass-fed beef, or beef trimmed of fat. Skinless chicken or turkey. Ground chicken or turkey. Pork trimmed of fat. All fish and seafood. Eggs. Dried beans, peas, or lentils. Unsalted nuts and seeds. Unsalted canned beans. °Dairy °Low-fat dairy products, such as skim or 1% milk, 2% or reduced-fat cheeses, low-fat ricotta or cottage cheese, or plain low-fat yogurt. Low-sodium or reduced-sodium cheeses. °Fats and Oils °Tub margarines without trans fats. Light or reduced-fat mayonnaise and salad dressings (reduced sodium). Avocado. Safflower, olive, or canola oils. Natural peanut or almond butter. °Other °Unsalted popcorn and pretzels. °The items listed above may not be a complete list of recommended foods or beverages. Contact your dietitian for more options. °WHAT FOODS ARE NOT RECOMMENDED? °Grains °White bread. White pasta. White rice. Refined cornbread. Bagels and croissants. Crackers that contain trans fat. °Vegetables °Creamed or fried vegetables. Vegetables in a cheese sauce. Regular canned vegetables. Regular canned tomato sauce and paste. Regular tomato and vegetable juices. °Fruits °Dried fruits. Canned fruit in light or heavy syrup. Fruit juice. °Meat and Other Protein Products °Fatty cuts of meat. Ribs, chicken wings, bacon, sausage, bologna, salami, chitterlings, fatback, hot dogs, bratwurst, and packaged luncheon meats. Salted nuts and seeds. Canned beans with salt. °Dairy °Whole or 2% milk, cream, half-and-half, and cream cheese. Whole-fat or sweetened yogurt. Full-fat   cheeses or blue cheese. Nondairy creamers and whipped toppings. Processed cheese, cheese spreads, or cheese  curds. °Condiments °Onion and garlic salt, seasoned salt, table salt, and sea salt. Canned and packaged gravies. Worcestershire sauce. Tartar sauce. Barbecue sauce. Teriyaki sauce. Soy sauce, including reduced sodium. Steak sauce. Fish sauce. Oyster sauce. Cocktail sauce. Horseradish. Ketchup and mustard. Meat flavorings and tenderizers. Bouillon cubes. Hot sauce. Tabasco sauce. Marinades. Taco seasonings. Relishes. °Fats and Oils °Butter, stick margarine, lard, shortening, ghee, and bacon fat. Coconut, palm kernel, or palm oils. Regular salad dressings. °Other °Pickles and olives. Salted popcorn and pretzels. °The items listed above may not be a complete list of foods and beverages to avoid. Contact your dietitian for more information. °WHERE CAN I FIND MORE INFORMATION? °National Heart, Lung, and Blood Institute: www.nhlbi.nih.gov/health/health-topics/topics/dash/ °Document Released: 04/04/2011 Document Revised: 08/30/2013 Document Reviewed: 02/17/2013 °[employer]® Patient Information ©2015 [employer]. This information is not intended to replace advice given to you by your health care provider. Make sure you discuss any questions you have with your health care provider. ° °

## 2014-04-14 ENCOUNTER — Telehealth: Payer: Self-pay | Admitting: Family Medicine

## 2014-04-14 DIAGNOSIS — Z13220 Encounter for screening for lipoid disorders: Secondary | ICD-10-CM

## 2014-04-14 DIAGNOSIS — Z125 Encounter for screening for malignant neoplasm of prostate: Secondary | ICD-10-CM

## 2014-04-14 DIAGNOSIS — R5382 Chronic fatigue, unspecified: Secondary | ICD-10-CM

## 2014-04-14 NOTE — Telephone Encounter (Signed)
Blood work orders placed in Epic. Patient notified. 

## 2014-04-14 NOTE — Telephone Encounter (Signed)
Pt is requesting blood work orders for yearly phy on 05/02/13

## 2014-04-14 NOTE — Telephone Encounter (Signed)
Lipi/liv/met 7/psa/cbc

## 2014-04-14 NOTE — Telephone Encounter (Signed)
Pt has not had labs on epic

## 2014-04-25 LAB — CBC WITH DIFFERENTIAL/PLATELET
BASOS PCT: 1 % (ref 0–1)
Basophils Absolute: 0.1 10*3/uL (ref 0.0–0.1)
Eosinophils Absolute: 0.1 10*3/uL (ref 0.0–0.7)
Eosinophils Relative: 1 % (ref 0–5)
HCT: 44.8 % (ref 39.0–52.0)
Hemoglobin: 14.8 g/dL (ref 13.0–17.0)
Lymphocytes Relative: 37 % (ref 12–46)
Lymphs Abs: 2.1 10*3/uL (ref 0.7–4.0)
MCH: 31 pg (ref 26.0–34.0)
MCHC: 33 g/dL (ref 30.0–36.0)
MCV: 93.7 fL (ref 78.0–100.0)
MONOS PCT: 7 % (ref 3–12)
MPV: 9.5 fL (ref 9.4–12.4)
Monocytes Absolute: 0.4 10*3/uL (ref 0.1–1.0)
NEUTROS ABS: 3 10*3/uL (ref 1.7–7.7)
NEUTROS PCT: 54 % (ref 43–77)
Platelets: 265 10*3/uL (ref 150–400)
RBC: 4.78 MIL/uL (ref 4.22–5.81)
RDW: 13.3 % (ref 11.5–15.5)
WBC: 5.6 10*3/uL (ref 4.0–10.5)

## 2014-04-26 LAB — HEPATIC FUNCTION PANEL
ALBUMIN: 4.1 g/dL (ref 3.5–5.2)
ALT: 18 U/L (ref 0–53)
AST: 17 U/L (ref 0–37)
Alkaline Phosphatase: 50 U/L (ref 39–117)
Bilirubin, Direct: 0.1 mg/dL (ref 0.0–0.3)
Indirect Bilirubin: 0.5 mg/dL (ref 0.2–1.2)
TOTAL PROTEIN: 6.5 g/dL (ref 6.0–8.3)
Total Bilirubin: 0.6 mg/dL (ref 0.2–1.2)

## 2014-04-26 LAB — BASIC METABOLIC PANEL
BUN: 16 mg/dL (ref 6–23)
CO2: 25 mEq/L (ref 19–32)
CREATININE: 0.86 mg/dL (ref 0.50–1.35)
Calcium: 9 mg/dL (ref 8.4–10.5)
Chloride: 106 mEq/L (ref 96–112)
Glucose, Bld: 99 mg/dL (ref 70–99)
POTASSIUM: 4.2 meq/L (ref 3.5–5.3)
SODIUM: 140 meq/L (ref 135–145)

## 2014-04-26 LAB — PSA: PSA: 0.76 ng/mL (ref <=4.00)

## 2014-04-26 LAB — LIPID PANEL
CHOL/HDL RATIO: 5.1 ratio
Cholesterol: 204 mg/dL — ABNORMAL HIGH (ref 0–200)
HDL: 40 mg/dL (ref >39)
LDL CALC: 116 mg/dL — AB (ref 0–99)
TRIGLYCERIDES: 242 mg/dL — AB (ref <150)
VLDL: 48 mg/dL — AB (ref 0–40)

## 2014-05-02 ENCOUNTER — Encounter: Payer: Self-pay | Admitting: Family Medicine

## 2014-05-02 ENCOUNTER — Ambulatory Visit (INDEPENDENT_AMBULATORY_CARE_PROVIDER_SITE_OTHER): Payer: BC Managed Care – PPO | Admitting: Family Medicine

## 2014-05-02 VITALS — BP 116/76 | Ht 68.5 in | Wt 195.0 lb

## 2014-05-02 DIAGNOSIS — Z0000 Encounter for general adult medical examination without abnormal findings: Secondary | ICD-10-CM

## 2014-05-02 NOTE — Progress Notes (Signed)
   Subjective:    Patient ID: Raymond Rosales, male    DOB: 1961-03-13, 54 y.o.   MRN: [id number]  HPI The patient comes in today for a wellness visit.    A review of their health history was completed.  A review of medications was also completed.  Any needed refills; N/A  Eating habits: good  Falls/  MVA accidents in past few months: none  Regular exercise: Walks daily  Specialist pt sees on regular basis: none  Preventative health issues were discussed.   Additional concerns: Patient has muscle tightness in his lower right calf muscle that has been present for several months now.   Safety measures dietary measures discussed in detail. Patient has lost weight is minimize salt in his diet  Review of Systems  Constitutional: Negative for fever, activity change and appetite change.  HENT: Negative for congestion and rhinorrhea.   Eyes: Negative for discharge.  Respiratory: Negative for cough and wheezing.   Cardiovascular: Negative for chest pain.  Gastrointestinal: Negative for vomiting, abdominal pain and blood in stool.  Genitourinary: Negative for frequency and difficulty urinating.  Musculoskeletal: Negative for neck pain.  Skin: Negative for rash.  Allergic/Immunologic: Negative for environmental allergies and food allergies.  Neurological: Negative for weakness and headaches.  Psychiatric/Behavioral: Negative for agitation.       Objective:   Physical Exam  Constitutional: He appears well-developed and well-nourished.  HENT:  Head: Normocephalic and atraumatic.  Right Ear: External ear normal.  Left Ear: External ear normal.  Nose: Nose normal.  Mouth/Throat: Oropharynx is clear and moist.  Eyes: EOM are normal. Pupils are equal, round, and reactive to light.  Neck: Normal range of motion. Neck supple. No thyromegaly present.  Cardiovascular: Normal rate, regular rhythm and normal heart sounds.   No murmur heard. Pulmonary/Chest: Effort normal and breath  sounds normal. No respiratory distress. He has no wheezes.  Abdominal: Soft. Bowel sounds are normal. He exhibits no distension and no mass. There is no tenderness.  Genitourinary: Penis normal.  Musculoskeletal: Normal range of motion. He exhibits no edema.  Lymphadenopathy:    He has no cervical adenopathy.  Neurological: He is alert. He exhibits normal muscle tone.  Skin: Skin is warm and dry. No erythema.  Psychiatric: He has a normal mood and affect. His behavior is normal. Judgment normal.   Patient complains of intermittent tightness in the right calf there is no apparent swelling he has had ultrasound several months ago which was negative patient might benefit from knee-high support stockings when he works I don't see any obvious evidence of any type of venous insufficiency I recommended for the patient to do stretching exercises let us know if ongoing troubles       Assessment & Plan:  Blood pressure much better patient will monitor on his own will let us know if it starts going up.  I went over the patient's cholesterol to actually improved compared where was 2 years ago. He'll watch diet closely. Repeat this again in one years time  Safety dietary measures all discussed. Patient states physically active. Report to [place] if any problems. Shingles vaccine recommended.

## 2014-06-22 ENCOUNTER — Ambulatory Visit (INDEPENDENT_AMBULATORY_CARE_PROVIDER_SITE_OTHER): Payer: BC Managed Care – PPO | Admitting: Family Medicine

## 2014-06-22 ENCOUNTER — Encounter: Payer: Self-pay | Admitting: Family Medicine

## 2014-06-22 VITALS — BP 132/94 | Temp 98.3°F | Ht 68.5 in | Wt 189.9 lb

## 2014-06-22 DIAGNOSIS — J019 Acute sinusitis, unspecified: Secondary | ICD-10-CM

## 2014-06-22 DIAGNOSIS — B9689 Other specified bacterial agents as the cause of diseases classified elsewhere: Secondary | ICD-10-CM

## 2014-06-22 MED ORDER — LEVOFLOXACIN 500 MG PO TABS: 500.0000 mg | ORAL_TABLET | Freq: Every day | ORAL | Status: DC

## 2014-06-22 NOTE — Progress Notes (Signed)
   Subjective:    Patient ID: Raymond Rosales, male    DOB: [birth year], 54 y.o.   MRN: [id number]  Cough This is a new problem. The current episode started in the past 7 days. Associated symptoms include rhinorrhea. Pertinent negatives include no chest pain, ear pain, fever or wheezing. Associated symptoms comments: Sinus drainage, loss of voice. Treatments tried: mucinex d.   Started last week during the storm Tuesday sinus Voice went then came back Mucinex D bid No pain No fevers No sweats    Review of Systems  Constitutional: Negative for fever and activity change.  HENT: Positive for congestion and rhinorrhea. Negative for ear pain.   Eyes: Negative for discharge.  Respiratory: Positive for cough. Negative for wheezing.   Cardiovascular: Negative for chest pain.       Objective:   Physical Exam  Constitutional: He appears well-developed.  HENT:  Head: Normocephalic.  Mouth/Throat: Oropharynx is clear and moist. No oropharyngeal exudate.  Neck: Normal range of motion.  Cardiovascular: Normal rate, regular rhythm and normal heart sounds.   No murmur heard. Pulmonary/Chest: Effort normal and breath sounds normal. He has no wheezes.  Lymphadenopathy:    He has no cervical adenopathy.  Neurological: He exhibits normal muscle tone.  Skin: Skin is warm and dry.  Nursing note and vitals reviewed.         Assessment & Plan:  Sinusitis antibiotics prescribed warning signs discussed follow-up if ongoing troubles.

## 2015-02-14 ENCOUNTER — Ambulatory Visit (INDEPENDENT_AMBULATORY_CARE_PROVIDER_SITE_OTHER): Payer: BC Managed Care – PPO | Admitting: Family Medicine

## 2015-02-14 ENCOUNTER — Encounter: Payer: Self-pay | Admitting: Family Medicine

## 2015-02-14 VITALS — BP 126/90 | Ht 68.5 in | Wt 199.1 lb

## 2015-02-14 DIAGNOSIS — R030 Elevated blood-pressure reading, without diagnosis of hypertension: Secondary | ICD-10-CM | POA: Diagnosis not present

## 2015-02-14 DIAGNOSIS — IMO0001 Reserved for inherently not codable concepts without codable children: Secondary | ICD-10-CM

## 2015-02-14 NOTE — Progress Notes (Signed)
   Subjective:    Patient ID: Raymond Rosales, male    DOB: [birth year], 54 y.o.   MRN: [id number]  Hypertension This is a recurrent problem. The current episode started more than 1 month ago. The problem is unchanged. Pertinent negatives include no chest pain. There are no associated agents to hypertension. There are no known risk factors for coronary artery disease. Past treatments include nothing. The current treatment provides no improvement. There are no compliance problems.    Patient states that he has no other concerns at this time.  Blood pressure was checked multiple times today with his machine and are wall unit although the bottom number closely correlates the top number on his machine estimates a good 8 points higher than what I got area  Healthy diet was discussed  Review of Systems  Constitutional: Negative for activity change, appetite change and fatigue.  HENT: Negative for congestion.   Respiratory: Negative for cough.   Cardiovascular: Negative for chest pain.  Gastrointestinal: Negative for abdominal pain.  Endocrine: Negative for polydipsia and polyphagia.  Neurological: Negative for weakness.  Psychiatric/Behavioral: Negative for confusion.       Objective:   Physical Exam  Constitutional: He appears well-nourished. No distress.  Cardiovascular: Normal rate, regular rhythm and normal heart sounds.   No murmur heard. Pulmonary/Chest: Effort normal and breath sounds normal. No respiratory distress.  Musculoskeletal: He exhibits no edema.  Lymphadenopathy:    He has no cervical adenopathy.  Neurological: He is alert.  Psychiatric: His behavior is normal.  Vitals reviewed.         Assessment & Plan:  Elevated blood pressure-some of his readings are elevated summer not the patient is going to take several readings over the next few weeks he will send them to [place] if they are not within acceptable range will be starting medication.

## 2015-03-30 ENCOUNTER — Telehealth: Payer: Self-pay | Admitting: Family Medicine

## 2015-03-30 DIAGNOSIS — I10 Essential (primary) hypertension: Secondary | ICD-10-CM

## 2015-03-30 DIAGNOSIS — Z125 Encounter for screening for malignant neoplasm of prostate: Secondary | ICD-10-CM

## 2015-03-30 DIAGNOSIS — E785 Hyperlipidemia, unspecified: Secondary | ICD-10-CM

## 2015-03-30 DIAGNOSIS — R5383 Other fatigue: Secondary | ICD-10-CM

## 2015-03-30 DIAGNOSIS — Z79899 Other long term (current) drug therapy: Secondary | ICD-10-CM

## 2015-03-30 NOTE — Telephone Encounter (Signed)
Pt dropped off his bp readings. Message in box.

## 2015-03-31 NOTE — Telephone Encounter (Signed)
bw orders ready.

## 2015-03-31 NOTE — Telephone Encounter (Signed)
Nurse's-please put in orders he has a physical in early January the patient is aware to get the blood drawn. No need to call. Needs order for CBC, met 7, liver, PSA, lipid-thanks-side note-I did review his blood pressure readings they look very good I did inform the patient., Patient will get labs drawn at [facility]

## 2015-04-21 LAB — CBC WITH DIFFERENTIAL/PLATELET
BASOS: 1 %
Basophils Absolute: 0 10*3/uL (ref 0.0–0.2)
EOS (ABSOLUTE): 0 10*3/uL (ref 0.0–0.4)
Eos: 1 %
Hematocrit: 46.3 % (ref 37.5–51.0)
Hemoglobin: 15.9 g/dL (ref 12.6–17.7)
Immature Grans (Abs): 0 10*3/uL (ref 0.0–0.1)
Immature Granulocytes: 1 %
Lymphocytes Absolute: 2.2 10*3/uL (ref 0.7–3.1)
Lymphs: 34 %
MCH: 31.7 pg (ref 26.6–33.0)
MCHC: 34.3 g/dL (ref 31.5–35.7)
MCV: 92 fL (ref 79–97)
MONOS ABS: 0.4 10*3/uL (ref 0.1–0.9)
Monocytes: 7 %
Neutrophils Absolute: 3.7 10*3/uL (ref 1.4–7.0)
Neutrophils: 56 %
PLATELETS: 269 10*3/uL (ref 150–379)
RBC: 5.01 x10E6/uL (ref 4.14–5.80)
RDW: 13.1 % (ref 12.3–15.4)
WBC: 6.4 10*3/uL (ref 3.4–10.8)

## 2015-04-21 LAB — LIPID PANEL
CHOL/HDL RATIO: 4.2 ratio (ref 0.0–5.0)
Cholesterol, Total: 218 mg/dL — ABNORMAL HIGH (ref 100–199)
HDL: 52 mg/dL (ref >39)
LDL Calculated: 139 mg/dL — ABNORMAL HIGH (ref 0–99)
Triglycerides: 134 mg/dL (ref 0–149)
VLDL Cholesterol Cal: 27 mg/dL (ref 5–40)

## 2015-04-21 LAB — BASIC METABOLIC PANEL
BUN / CREAT RATIO: 24 — AB (ref 9–20)
BUN: 20 mg/dL (ref 6–24)
CALCIUM: 9 mg/dL (ref 8.7–10.2)
CO2: 24 mmol/L (ref 18–29)
Chloride: 101 mmol/L (ref 96–106)
Creatinine, Ser: 0.85 mg/dL (ref 0.76–1.27)
GFR, EST AFRICAN AMERICAN: 113 mL/min/{1.73_m2} (ref >59)
GFR, EST NON AFRICAN AMERICAN: 98 mL/min/{1.73_m2} (ref >59)
Glucose: 91 mg/dL (ref 65–99)
POTASSIUM: 4.5 mmol/L (ref 3.5–5.2)
SODIUM: 138 mmol/L (ref 134–144)

## 2015-04-21 LAB — HEPATIC FUNCTION PANEL
ALT: 22 IU/L (ref 0–44)
AST: 17 IU/L (ref 0–40)
Albumin: 4.6 g/dL (ref 3.5–5.5)
Alkaline Phosphatase: 53 IU/L (ref 39–117)
BILIRUBIN TOTAL: 0.5 mg/dL (ref 0.0–1.2)
Bilirubin, Direct: 0.11 mg/dL (ref 0.00–0.40)
Total Protein: 6.8 g/dL (ref 6.0–8.5)

## 2015-04-21 LAB — PSA: Prostate Specific Ag, Serum: 0.9 ng/mL (ref 0.0–4.0)

## 2015-05-04 ENCOUNTER — Ambulatory Visit (INDEPENDENT_AMBULATORY_CARE_PROVIDER_SITE_OTHER): Payer: BC Managed Care – PPO | Admitting: Family Medicine

## 2015-05-04 ENCOUNTER — Encounter: Payer: Self-pay | Admitting: Family Medicine

## 2015-05-04 VITALS — BP 128/88 | Ht 68.75 in | Wt 203.0 lb

## 2015-05-04 DIAGNOSIS — R358 Other polyuria: Secondary | ICD-10-CM

## 2015-05-04 DIAGNOSIS — Z0000 Encounter for general adult medical examination without abnormal findings: Secondary | ICD-10-CM

## 2015-05-04 DIAGNOSIS — R3589 Other polyuria: Secondary | ICD-10-CM

## 2015-05-04 LAB — POCT URINALYSIS DIPSTICK
Spec Grav, UA: <=1.005
pH, UA: 7

## 2015-05-04 NOTE — Progress Notes (Signed)
   Subjective:    Patient ID: Raymond Rosales, male    DOB: [birth year], 55 y.o.   MRN: [id number]  HPI The patient comes in today for a wellness visit.  Colonoscopy 2 year ago.   A review of their health history was completed.  A review of medications was also completed.  Any needed refills; none  Eating habits:   Falls/  MVA accidents in past few months: none  Regular exercise: walks every am 30 min on treadmill.   Specialist pt sees on regular basis: none  Preventative health issues were discussed.   Additional concerns: check BP, runny nose and cough. Started on dec 25th. No fever. Taking sudafed.   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 Having pain/spasm. Pain wakes him from sleep. Started 10 years ago. May happen twice a year. Pain last a few minutes.    Review of Systems  Constitutional: Negative for fever, activity change and appetite change.  HENT: Negative for congestion, ear pain and rhinorrhea.   Eyes: Negative for discharge.  Respiratory: Negative for cough and wheezing.   Cardiovascular: Negative for chest pain.  Gastrointestinal: Negative for vomiting, abdominal pain and blood in stool.  Genitourinary: Negative for frequency and difficulty urinating.  Musculoskeletal: Negative for neck pain.  Skin: Negative for rash.  Allergic/Immunologic: Negative for environmental allergies and food allergies.  Neurological: Negative for weakness and headaches.  Psychiatric/Behavioral: Negative for agitation.       Objective:   Physical Exam  Constitutional: He appears well-developed and well-nourished.  HENT:  Head: Normocephalic and atraumatic.  Right Ear: External ear normal.  Left Ear: External ear normal.  Nose: Nose normal.  Mouth/Throat: Oropharynx is clear and moist.  Eyes: EOM are normal. Pupils are equal, round, and reactive to light.  Neck: Normal range of motion. Neck supple. No thyromegaly present.  Cardiovascular: Normal rate, regular rhythm and normal  heart sounds.   No murmur heard. Pulmonary/Chest: Effort normal and breath sounds normal. No respiratory distress. He has no wheezes.  Abdominal: Soft. Bowel sounds are normal. He exhibits no distension and no mass. There is no tenderness.  Genitourinary: Penis normal.  Musculoskeletal: Normal range of motion. He exhibits no edema.  Lymphadenopathy:    He has no cervical adenopathy.  Neurological: He is alert. He exhibits normal muscle tone.  Skin: Skin is warm and dry. No erythema.  Psychiatric: He has a normal mood and affect. His behavior is normal. Judgment normal.    I did check urinalysis to make sure no blood cells in the urine given his borderline blood pressure      Assessment & Plan:  Rectal area pain-it is hard to know if this is psiform muscle causing pain or "levator" syndrome. Rectal exam is normal. Patient is up-to-date on colonoscopy. The pain is truly not an internal pain or a: Discomfort. It's more of a discomfort at the base of the penis and the rectum region. If it persists or worsens referral to specialist  Mild head cold currently should gradually get better on its own  [facility] dietary measures all discussed Importance of regular exercise Keep up-to-date on flu shot Healthy diet Colonoscopy due in 2019 Lab work overall looks good. LDL mildly elevated but the importance of healthy diet discussed patient does not need statin at this point in addition to this patient does need yearly wellness exam I do recommend the patient consider follow-up in 6 months to recheck blood pressure as well as recheck cholesterol profile

## 2015-06-21 IMAGING — US US EXTREM LOW VENOUS*R*
1 series · 13 of 24 positions shown · non-contrast
Comparison: None

CLINICAL DATA: RIGHT leg swelling



[Series 1: us extrem low venous*right* · 0.08mm/px · 13 of 32 slices shown]
[im 1/32]
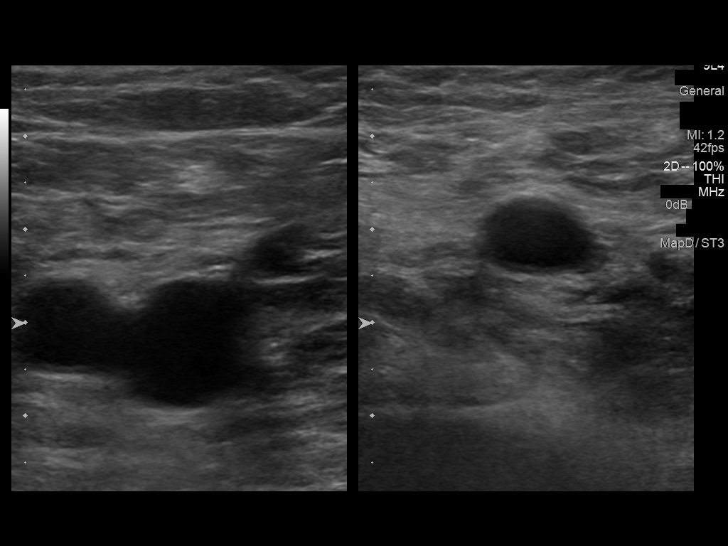
[im 3/32]
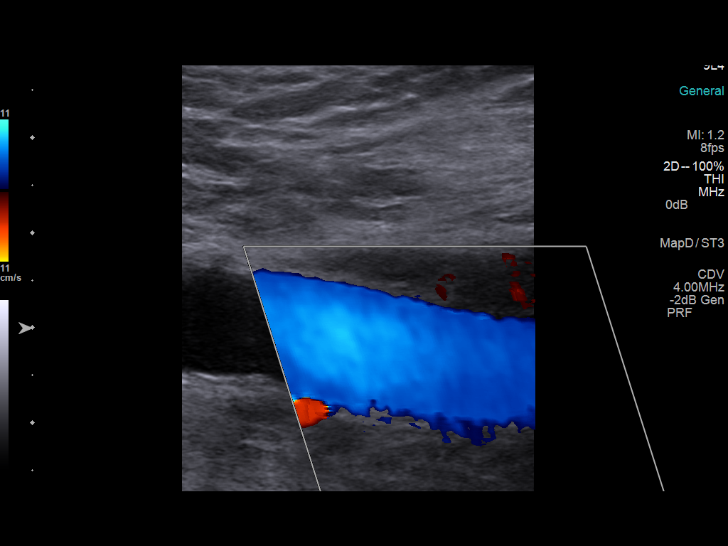
[im 6/32]
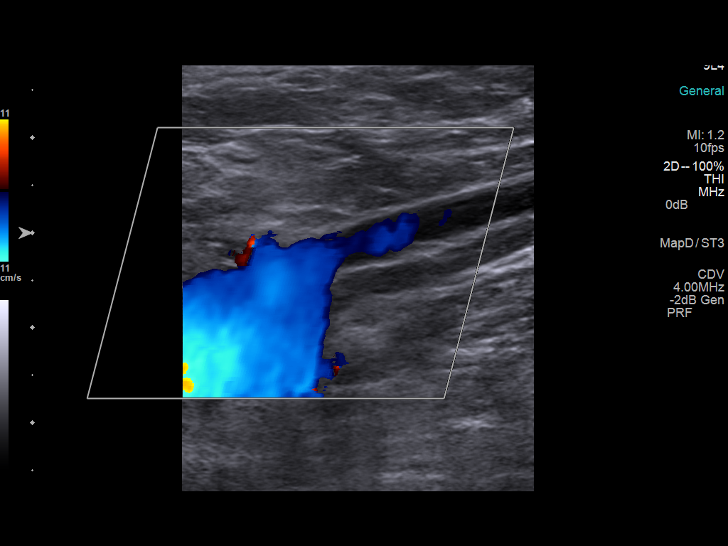
[im 9/32]
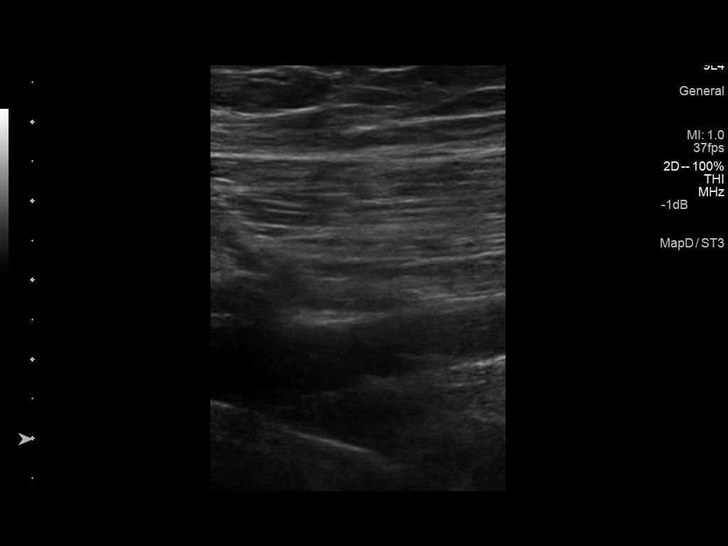
[im 11/32]
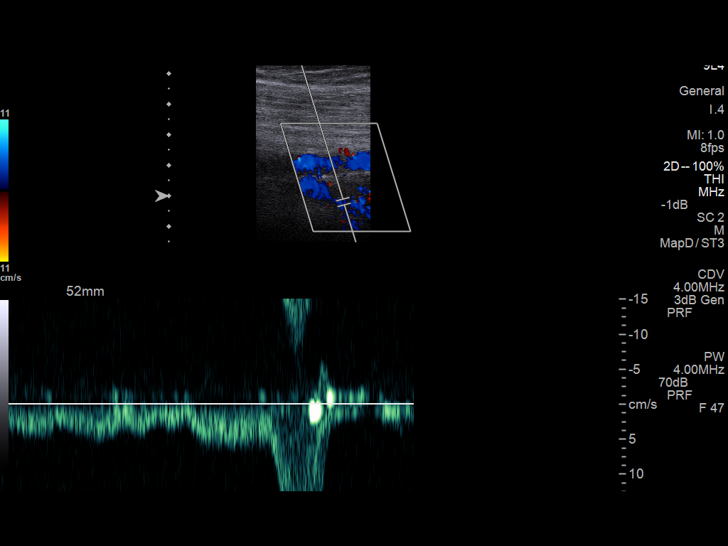
[im 14/32]
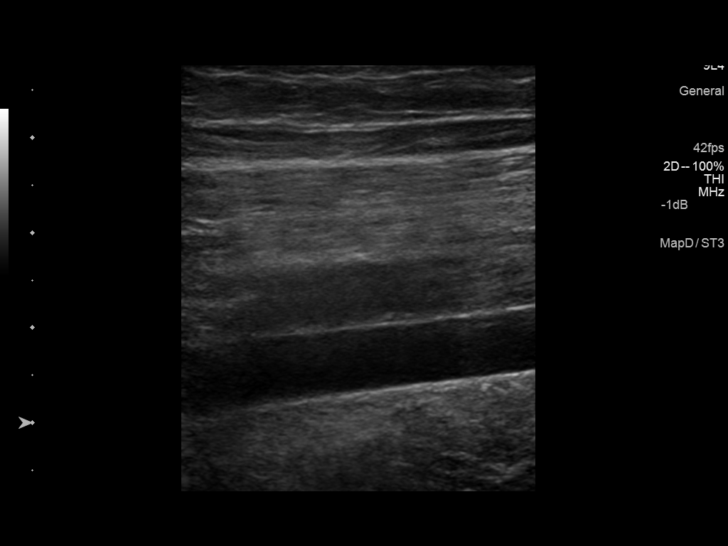
[im 17/32]
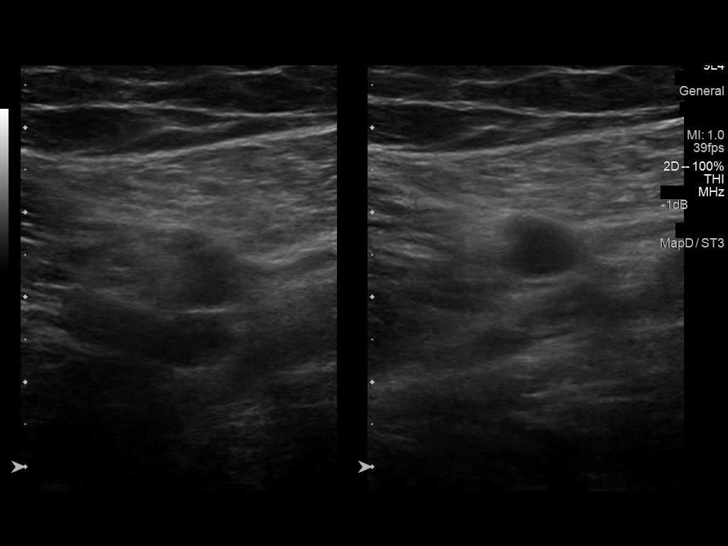
[im 18/32]
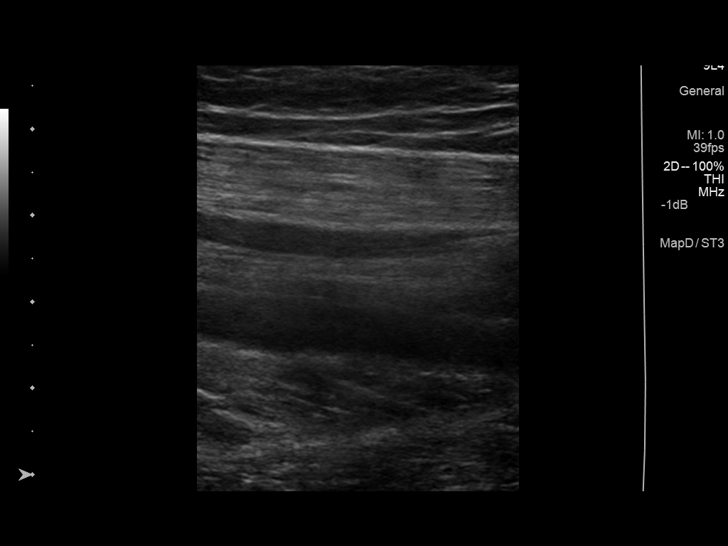
[im 21/32]
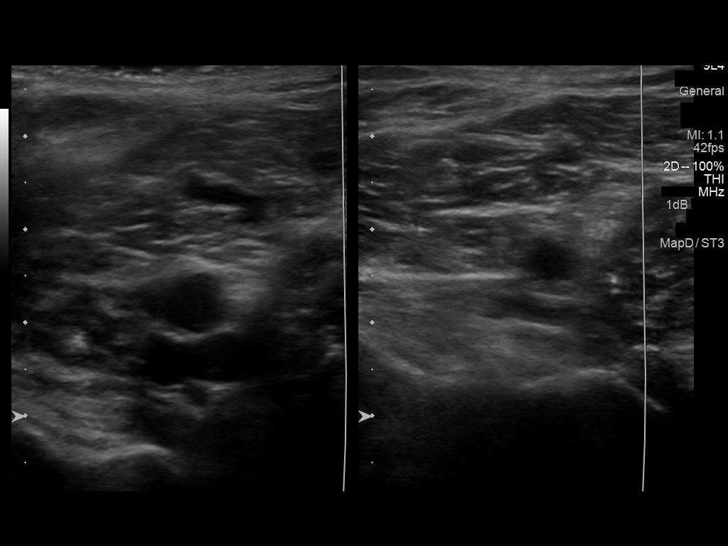
[im 23/32]
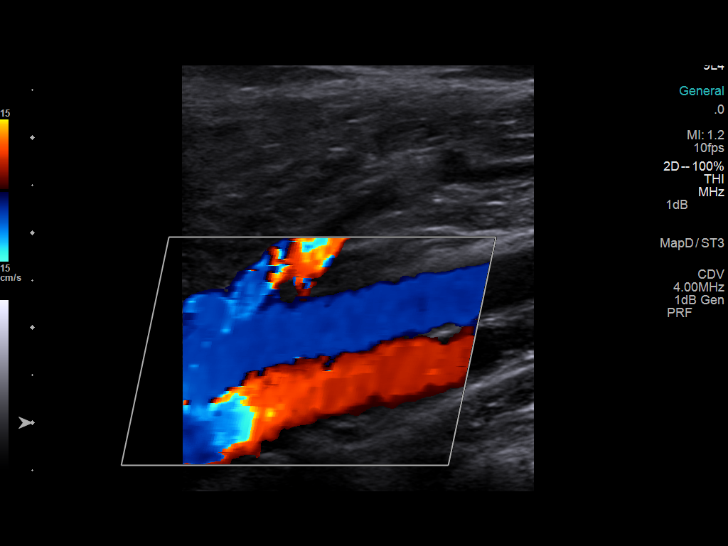
[im 26/32]
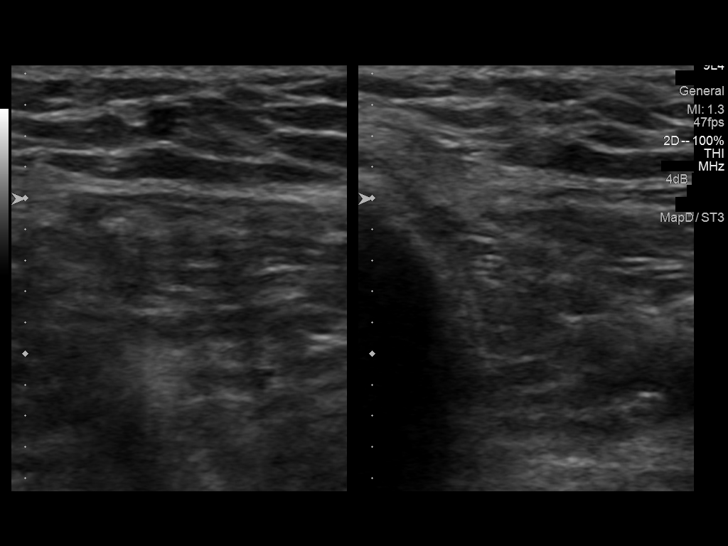
[im 29/32]
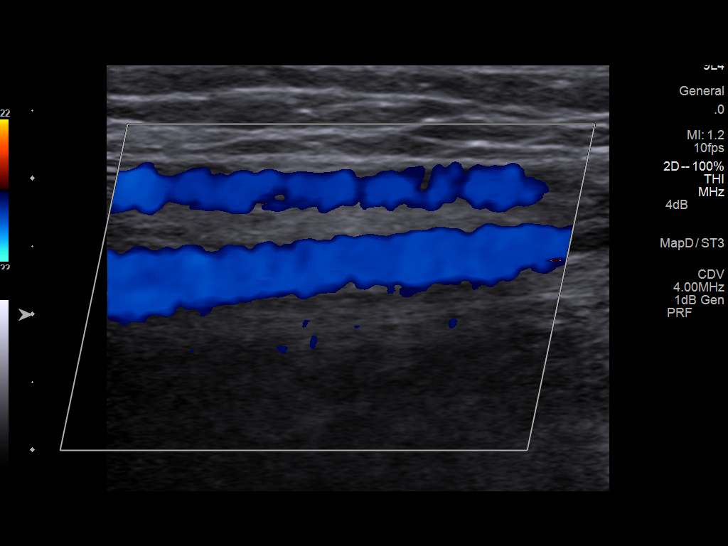
[im 32/32]
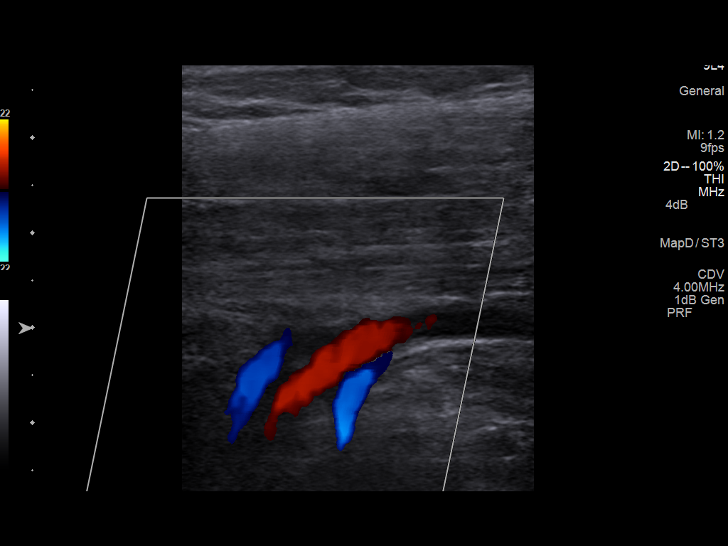

[13 of 24 positions shown; findings below may reference images not displayed]

FINDINGS: Common Femoral Vein: No evidence of thrombus. Normal
compressibility, respiratory phasicity and response to augmentation.

Saphenofemoral Junction: No evidence of thrombus. Normal
compressibility and flow on color Doppler imaging.

Profunda Femoral Vein: No evidence of thrombus. Normal
compressibility and flow on color Doppler imaging.

Femoral Vein: No evidence of thrombus. Normal compressibility,
respiratory phasicity and response to augmentation.

Popliteal Vein: No evidence of thrombus. Normal compressibility,
respiratory phasicity and response to augmentation.

Calf Veins: No evidence of thrombus. Normal compressibility and flow
on color Doppler imaging.

Superficial Great Saphenous Vein: No evidence of thrombus. Normal
compressibility and flow on color Doppler imaging.

Venous Reflux:  None.

Other Findings:  None.
IMPRESSION: No evidence of deep venous thrombosis in RIGHT lower extremity.

## 2015-06-23 ENCOUNTER — Ambulatory Visit (INDEPENDENT_AMBULATORY_CARE_PROVIDER_SITE_OTHER): Payer: BC Managed Care – PPO | Admitting: Family Medicine

## 2015-06-23 ENCOUNTER — Encounter: Payer: Self-pay | Admitting: Family Medicine

## 2015-06-23 VITALS — BP 140/86 | Temp 98.4°F | Ht 68.75 in | Wt 208.6 lb

## 2015-06-23 DIAGNOSIS — B9689 Other specified bacterial agents as the cause of diseases classified elsewhere: Secondary | ICD-10-CM

## 2015-06-23 DIAGNOSIS — J019 Acute sinusitis, unspecified: Secondary | ICD-10-CM | POA: Diagnosis not present

## 2015-06-23 MED ORDER — LEVOFLOXACIN 500 MG PO TABS: 500.0000 mg | ORAL_TABLET | Freq: Every day | ORAL | Status: DC

## 2015-06-23 MED ORDER — HYDROCODONE-HOMATROPINE 5-1.5 MG/5ML PO SYRP: 5.0000 mL | ORAL_SOLUTION | Freq: Four times a day (QID) | ORAL | Status: DC | PRN

## 2015-06-23 MED ORDER — OSELTAMIVIR PHOSPHATE 75 MG PO CAPS: 75.0000 mg | ORAL_CAPSULE | Freq: Two times a day (BID) | ORAL | Status: DC

## 2015-06-23 NOTE — Progress Notes (Signed)
   Subjective:    Patient ID: Raymond Rosales, male    DOB: 1960-09-01, 55 y.o.   MRN: [id number]  Cough This is a new problem. The current episode started yesterday. Associated symptoms include headaches, nasal congestion and rhinorrhea. Pertinent negatives include no chest pain, ear pain, fever or wheezing. He has tried OTC cough suppressant (advil) for the symptoms.   Several days head congestion now with sore throat sinus pressure pain denies high fever chills sweats   Review of Systems  Constitutional: Negative for fever and activity change.  HENT: Positive for congestion and rhinorrhea. Negative for ear pain.   Eyes: Negative for discharge.  Respiratory: Positive for cough. Negative for wheezing.   Cardiovascular: Negative for chest pain.  Neurological: Positive for headaches.       Objective:   Physical Exam  Constitutional: He appears well-developed.  HENT:  Head: Normocephalic.  Mouth/Throat: Oropharynx is clear and moist. No oropharyngeal exudate.  Neck: Normal range of motion.  Cardiovascular: Normal rate, regular rhythm and normal heart sounds.   No murmur heard. Pulmonary/Chest: Effort normal and breath sounds normal. He has no wheezes.  Lymphadenopathy:    He has no cervical adenopathy.  Neurological: He exhibits normal muscle tone.  Skin: Skin is warm and dry.  Nursing note and vitals reviewed.         Assessment & Plan:  Viral syndrome Patient was seen today for upper respiratory illness. It is felt that the patient is dealing with sinusitis. Antibiotics were prescribed today. Importance of compliance with medication was discussed. Symptoms should gradually resolve over the course of the next several days. If high fevers, progressive illness, difficulty breathing, worsening condition or failure for symptoms to improve over the next several days then the patient is to follow-up. If any emergent conditions the patient is to follow-up in the emergency department  otherwise to follow-up in the office. I cannot rule out the possibility early case of the flu I gave him warning symptoms to watch for plus also prescription for Tamiflu flu if he should have onset of symptoms. Warning signs were discussed.

## 2015-07-06 ENCOUNTER — Ambulatory Visit (INDEPENDENT_AMBULATORY_CARE_PROVIDER_SITE_OTHER): Payer: BC Managed Care – PPO | Admitting: Family Medicine

## 2015-07-06 ENCOUNTER — Encounter: Payer: Self-pay | Admitting: Family Medicine

## 2015-07-06 VITALS — BP 128/80 | Temp 98.0°F | Ht 68.75 in | Wt 202.4 lb

## 2015-07-06 DIAGNOSIS — J111 Influenza due to unidentified influenza virus with other respiratory manifestations: Secondary | ICD-10-CM | POA: Diagnosis not present

## 2015-07-06 NOTE — Progress Notes (Signed)
   Subjective:    Patient ID: Raymond Rosales, male    DOB: November 18, 1960, 55 y.o.   MRN: [id number]  Cough This is a new problem. The current episode started in the past 7 days. The problem has been unchanged. The cough is non-productive. Nothing aggravates the symptoms. Treatments tried: Mucinex DM. The treatment provided mild relief.   Patient states that he has no other concerns at this time.   last time he was seen his illness turned into the flu patients had persistent cough his wife was worried about it he denies any fever chills or sweats  Review of Systems  Respiratory: Positive for cough.     denies fever chills sweats.    Objective:   Physical Exam  lungs clear no crackle heart regular HEENT is benign       Assessment & Plan:   post viral cough no sign of bacterial complication no antibiotics recommended. Warning warning signs were discussed in detail

## 2016-03-18 ENCOUNTER — Telehealth: Payer: Self-pay | Admitting: Family Medicine

## 2016-03-18 DIAGNOSIS — Z125 Encounter for screening for malignant neoplasm of prostate: Secondary | ICD-10-CM

## 2016-03-18 DIAGNOSIS — I10 Essential (primary) hypertension: Secondary | ICD-10-CM

## 2016-03-18 DIAGNOSIS — Z0000 Encounter for general adult medical examination without abnormal findings: Secondary | ICD-10-CM

## 2016-03-18 NOTE — Telephone Encounter (Signed)
Calling to request blood work orders for upcoming physical in January.

## 2016-03-18 NOTE — Telephone Encounter (Signed)
Lipid, liver, metabolic 7, CBC, PSA °

## 2016-03-18 NOTE — Telephone Encounter (Signed)
Orders ready. Pt notified.  

## 2016-05-02 LAB — HEPATIC FUNCTION PANEL
ALBUMIN: 4.5 g/dL (ref 3.5–5.5)
ALK PHOS: 53 IU/L (ref 39–117)
ALT: 18 IU/L (ref 0–44)
AST: 17 IU/L (ref 0–40)
BILIRUBIN TOTAL: 0.4 mg/dL (ref 0.0–1.2)
BILIRUBIN, DIRECT: 0.1 mg/dL (ref 0.00–0.40)
Total Protein: 7.1 g/dL (ref 6.0–8.5)

## 2016-05-02 LAB — BASIC METABOLIC PANEL
BUN / CREAT RATIO: 21 — AB (ref 9–20)
BUN: 21 mg/dL (ref 6–24)
CALCIUM: 9.4 mg/dL (ref 8.7–10.2)
CO2: 25 mmol/L (ref 18–29)
Chloride: 103 mmol/L (ref 96–106)
Creatinine, Ser: 1.02 mg/dL (ref 0.76–1.27)
GFR, EST AFRICAN AMERICAN: 95 mL/min/{1.73_m2} (ref >59)
GFR, EST NON AFRICAN AMERICAN: 82 mL/min/{1.73_m2} (ref >59)
Glucose: 94 mg/dL (ref 65–99)
POTASSIUM: 4.9 mmol/L (ref 3.5–5.2)
SODIUM: 141 mmol/L (ref 134–144)

## 2016-05-02 LAB — CBC WITH DIFFERENTIAL/PLATELET
BASOS: 1 %
Basophils Absolute: 0 10*3/uL (ref 0.0–0.2)
EOS (ABSOLUTE): 0.1 10*3/uL (ref 0.0–0.4)
Eos: 1 %
Hematocrit: 42.9 % (ref 37.5–51.0)
Hemoglobin: 14.9 g/dL (ref 13.0–17.7)
IMMATURE GRANS (ABS): 0 10*3/uL (ref 0.0–0.1)
Immature Granulocytes: 0 %
LYMPHS ABS: 2 10*3/uL (ref 0.7–3.1)
LYMPHS: 36 %
MCH: 31.9 pg (ref 26.6–33.0)
MCHC: 34.7 g/dL (ref 31.5–35.7)
MCV: 92 fL (ref 79–97)
Monocytes Absolute: 0.4 10*3/uL (ref 0.1–0.9)
Monocytes: 7 %
NEUTROS ABS: 3.1 10*3/uL (ref 1.4–7.0)
Neutrophils: 55 %
Platelets: 251 10*3/uL (ref 150–379)
RBC: 4.67 x10E6/uL (ref 4.14–5.80)
RDW: 12.7 % (ref 12.3–15.4)
WBC: 5.6 10*3/uL (ref 3.4–10.8)

## 2016-05-02 LAB — LIPID PANEL
Chol/HDL Ratio: 4.5 ratio units (ref 0.0–5.0)
Cholesterol, Total: 214 mg/dL — ABNORMAL HIGH (ref 100–199)
HDL: 48 mg/dL (ref >39)
LDL Calculated: 138 mg/dL — ABNORMAL HIGH (ref 0–99)
TRIGLYCERIDES: 139 mg/dL (ref 0–149)
VLDL Cholesterol Cal: 28 mg/dL (ref 5–40)

## 2016-05-02 LAB — PSA: Prostate Specific Ag, Serum: 0.7 ng/mL (ref 0.0–4.0)

## 2016-05-06 ENCOUNTER — Ambulatory Visit (INDEPENDENT_AMBULATORY_CARE_PROVIDER_SITE_OTHER): Payer: BC Managed Care – PPO | Admitting: Family Medicine

## 2016-05-06 ENCOUNTER — Encounter: Payer: Self-pay | Admitting: Family Medicine

## 2016-05-06 VITALS — BP 122/86 | Ht 68.75 in | Wt 206.5 lb

## 2016-05-06 DIAGNOSIS — Z0000 Encounter for general adult medical examination without abnormal findings: Secondary | ICD-10-CM | POA: Diagnosis not present

## 2016-05-06 DIAGNOSIS — L989 Disorder of the skin and subcutaneous tissue, unspecified: Secondary | ICD-10-CM

## 2016-05-06 MED ORDER — AZITHROMYCIN 250 MG PO TABS: ORAL_TABLET | ORAL | 0 refills | Status: DC

## 2016-05-06 NOTE — Progress Notes (Signed)
Subjective:    Patient ID: Raymond Rosales, male    DOB: [birth year], 56 y.o.   MRN: [id number]  HPI The patient comes in today for a wellness visit.    A review of their health history was completed.  A review of medications was also completed.  Any needed refills:none  Eating habits: good  Falls/  MVA accidents in past few months: none  Regular exercise: daily walking  Specialist pt sees on regular basis: none  Preventative health issues were discussed.   Additional concerns: sinus issues started 2 days ago  Treatments tried: sudafed, ibuprofen  Right leg discomfort started several months ago. Patient concerned about varicose veins.  Lab work was reviewed with the patient. His cholesterol mildly elevated. His risk ratio 6.5% over the next 10 years healthy diet regular physical activity recommended Weight loss recommended  Review of Systems  Constitutional: Negative for activity change, appetite change and fever.  HENT: Negative for congestion and rhinorrhea.   Eyes: Negative for discharge.  Respiratory: Negative for cough and wheezing.   Cardiovascular: Negative for chest pain.  Gastrointestinal: Negative for abdominal pain, blood in stool and vomiting.  Genitourinary: Negative for difficulty urinating and frequency.  Musculoskeletal: Negative for neck pain.  Skin: Negative for rash.  Allergic/Immunologic: Negative for environmental allergies and food allergies.  Neurological: Negative for weakness and headaches.  Psychiatric/Behavioral: Negative for agitation.       Objective:   Physical Exam  Constitutional: He appears well-developed and well-nourished.  HENT:  Head: Normocephalic and atraumatic.  Right Ear: External ear normal.  Left Ear: External ear normal.  Nose: Nose normal.  Mouth/Throat: Oropharynx is clear and moist.  Eyes: EOM are normal. Pupils are equal, round, and reactive to light.  Neck: Normal range of motion. Neck supple. No thyromegaly  present.  Cardiovascular: Normal rate, regular rhythm and normal heart sounds.   No murmur heard. Pulmonary/Chest: Effort normal and breath sounds normal. No respiratory distress. He has no wheezes.  Abdominal: Soft. Bowel sounds are normal. He exhibits no distension and no mass. There is no tenderness.  Genitourinary: Penis normal.  Musculoskeletal: Normal range of motion. He exhibits no edema.  Lymphadenopathy:    He has no cervical adenopathy.  Neurological: He is alert. He exhibits normal muscle tone.  Skin: Skin is warm and dry. No erythema.  Psychiatric: He has a normal mood and affect. His behavior is normal. Judgment normal.   Prostate exam normal  On the side of his head near the ear he has a abnormal moles that could be an early skin cancer I recommend dermatology referral     Assessment & Plan:  I have encouraged patient to lose some weight over the course of the next year. I would recommend the patient follow-up again in one years time for wellness check  Adult wellness-complete.wellness physical was conducted today. Importance of diet and exercise were discussed in detail. In addition to this a discussion regarding safety was also covered. We also reviewed over immunizations and gave recommendations regarding current immunization needed for age. In addition to this additional areas were also touched on including: Preventative health exams needed: Colonoscopy patient is up-to-date next colonoscopy 2019  Patient was advised yearly wellness exam  Cholesterol mildly elevated patient was encouraged to watch diet closely he is encouraged to recheck cholesterol profile in 6 months time he will send [place] blood pressure readings in approximately 4-6 weeks  Patient has 1 nodule on the side of his head recommend  dermatology referral

## 2016-05-13 ENCOUNTER — Encounter: Payer: Self-pay | Admitting: Family Medicine

## 2017-02-05 ENCOUNTER — Ambulatory Visit (INDEPENDENT_AMBULATORY_CARE_PROVIDER_SITE_OTHER): Payer: BC Managed Care – PPO | Admitting: Family Medicine

## 2017-02-05 ENCOUNTER — Encounter: Payer: Self-pay | Admitting: Family Medicine

## 2017-02-05 VITALS — BP 138/90 | Ht 68.0 in | Wt 206.0 lb

## 2017-02-05 DIAGNOSIS — N432 Other hydrocele: Secondary | ICD-10-CM | POA: Diagnosis not present

## 2017-02-05 DIAGNOSIS — I861 Scrotal varices: Secondary | ICD-10-CM

## 2017-02-05 NOTE — Progress Notes (Signed)
   Subjective:    Patient ID: Raymond Rosales, male    DOB: Oct 29, 1960, 56 y.o.   MRN: [id number]  HPIPossible hernia.  This patient is had a mild hernia of the umbilicus he now notices a swelling in the groin he wonders if he is developed a hernia he denies fever chills sweats denies vomiting diarrhea or any redness PMH benign 15 minutes spent with patient greater than half in discussion of the diagnosis  Review of Systems Please see above no chest tightness cough headache vomiting fever chills sweats    Objective:   Physical Exam Abdomen is soft no guarding or rebound Small umbilical hernia not incarcerated  No hernia detected, patient does hve varicoceleson the right side as well as a small hydrocele testicular exam is normal    Assessment & Plan:  Varicoceles Small hydrocele No sign of any hernia or tumor in the groin Discussion held follow-up if ongoing troubles Urology referral if ongoing troubles or if worsening issues Small umbilical hernia no need for referral currently until patient is ready  15 minutes spent with patient greater than half in discussion

## 2017-02-05 NOTE — Patient Instructions (Signed)
Varicocele  Hydrocele, Adult A hydrocele is a collection of fluid in the loose pouch of skin that holds the testicles (scrotum). Usually, it affects only one testicle. What are the causes? This condition may be caused by:  An injury to the scrotum.  An infection.  A tumor or cancer of the testicle.  Twisting of a testicle.  Decreased blood flow to the scrotum.  What are the signs or symptoms? A hydrocele feels like a water-filled balloon. It may also feel heavy. A hydrocele can cause:  Swelling of the scrotum. The swelling may decrease when you lie down.  Swelling of the groin.  Mild discomfort in the scrotum.  Pain. This can develop if the hydrocele was caused by infection or twisting.  How is this diagnosed? This condition may be diagnosed with a medical history, physical exam, and imaging tests. You may also have blood and urine tests to check for infection. How is this treated? Treatment may include:  Watching and waiting, particularly if the hydrocele causes no symptoms.  Treatment of the underlying condition. This may include using antibiotic medicine.  Surgery to drain the fluid. Some surgical options include: ? Needle aspiration. For this procedure, a needle is used to drain fluid. ? Hydrocelectomy. For this procedure, an incision is made in the scrotum to remove the fluid sac.  Follow these instructions at home:  Keep all follow-up visits as told by your health care provider. This is important.  Watch the hydrocele for any changes.  Take over-the-counter and prescription medicines only as told by your health care provider.  If you were prescribed an antibiotic medicine, use it as told by your health care provider. Do not stop using the antibiotic even if your condition improves. Contact a health care provider if:  The swelling in your scrotum or groin gets worse.  The hydrocele becomes red, firm, tender to the touch, or painful.  You notice any changes  in the hydrocele.  You have a fever. This information is not intended to replace advice given to you by your health care provider. Make sure you discuss any questions you have with your health care provider. Document Released: 10/03/2009 Document Revised: 09/21/2015 Document Reviewed: 04/11/2014 Elsevier Interactive Patient Education  2018 [employer].  Varicocele A varicocele is a swelling of veins in the scrotum. The scrotum is the sac that contains the testicles. Varicoceles can occur on either side of the scrotum, but they are more common on the left side. They occur most often in teenage boys and young men. In most cases, varicoceles are not a serious problem. They are usually small and painless and do not require treatment. Tests may be done to confirm the diagnosis. Treatment may be needed if:  A varicocele is large, causes a lot of pain, or causes pain when exercising.  Varicoceles are found on both sides of the scrotum.  The testicle on the opposite side is absent or not normal.  A varicocele causes a decrease in the size of the testicle in a growing adolescent.  The person has fertility problems.  What are the causes? This condition is the result of valves in the veins not working properly. Valves in the veins help to return blood from the scrotum and testicles to the heart. If these valves do not work well, blood flows backward and backs up into the veins, which causes the veins to swell. This is similar to what happens when varicose veins form in the leg. What are the  signs or symptoms? Most varicoceles do not cause any symptoms. If symptoms do occur, they may include:  Swelling on one side of the scrotum. The swelling may be more obvious when you are standing up.  A lumpy feeling in the scrotum.  A heavy feeling on one side of the scrotum.  A dull ache in the scrotum, especially after exercise or prolonged standing or sitting.  Slower growth or reduced size of the  testicle on the side of the varicocele (in young males).  Problems with fertility. These can occur if the testicle does not grow normally.  How is this diagnosed? This condition may be diagnosed with a physical exam. You may also have an imaging test, called an ultrasound, to confirm the diagnosis and to help rule out other causes of the swelling. How is this treated? Treatment is usually not needed for this condition. If you have any pain, your health care provider may prescribe or recommend medicine to help relieve it. You may need regular exams so your health care provider can monitor the varicocele to ensure that it does not cause problems. When further treatment is needed, it may involve one of these options:  Varicocelectomy. This is a surgery in which the swollen veins are tied off so that the flow of blood goes to other veins instead.  Embolization. In this procedure, a small tube (catheter) is used to place metal coils or other blocking items in the veins. This cuts off the blood flow to the swollen veins.  Follow these instructions at home:  Take medicines only as directed by your health care provider.  Wear supportive underwear.  Use an athletic supporter for sports.  Keep all follow-up visits as directed by your health care provider. This is important. Contact a health care provider if:  Your pain is increasing.  You have redness in the affected area.  You have swelling that does not decrease when you are lying down.  One of your testicles is smaller than the other.  Your testicle becomes enlarged, swollen, or painful. This information is not intended to replace advice given to you by your health care provider. Make sure you discuss any questions you have with your health care provider. Document Released: 07/22/2000 Document Revised: 09/27/2015 Document Reviewed: 03/23/2014 Elsevier Interactive Patient Education  [employer].

## 2017-04-23 ENCOUNTER — Telehealth: Payer: Self-pay | Admitting: Family Medicine

## 2017-04-23 DIAGNOSIS — I10 Essential (primary) hypertension: Secondary | ICD-10-CM

## 2017-04-23 DIAGNOSIS — Z79899 Other long term (current) drug therapy: Secondary | ICD-10-CM

## 2017-04-23 DIAGNOSIS — Z125 Encounter for screening for malignant neoplasm of prostate: Secondary | ICD-10-CM

## 2017-04-23 NOTE — Telephone Encounter (Signed)
Lipid, liver, metabolic 7, CBC, PSA °

## 2017-04-23 NOTE — Telephone Encounter (Signed)
Pt is requesting lab orders to be sent over. Last labs per epic were: psa,cbc,bmp,hepatic, and lipid on 05/01/2016.

## 2017-04-24 NOTE — Addendum Note (Signed)
Addended by: Dairl Ponder on: 04/24/2017 01:56 PM   Modules accepted: Orders

## 2017-04-24 NOTE — Telephone Encounter (Signed)
Blood work ordered in Epic. Patient notified. 

## 2017-05-07 ENCOUNTER — Encounter: Payer: BC Managed Care – PPO | Admitting: Family Medicine

## 2017-05-08 LAB — CBC WITH DIFFERENTIAL/PLATELET
BASOS: 0 %
Basophils Absolute: 0 10*3/uL (ref 0.0–0.2)
EOS (ABSOLUTE): 0 10*3/uL (ref 0.0–0.4)
EOS: 1 %
HEMATOCRIT: 44.2 % (ref 37.5–51.0)
Hemoglobin: 15.3 g/dL (ref 13.0–17.7)
IMMATURE GRANULOCYTES: 0 %
Immature Grans (Abs): 0 10*3/uL (ref 0.0–0.1)
LYMPHS ABS: 1.9 10*3/uL (ref 0.7–3.1)
Lymphs: 36 %
MCH: 31.2 pg (ref 26.6–33.0)
MCHC: 34.6 g/dL (ref 31.5–35.7)
MCV: 90 fL (ref 79–97)
MONOS ABS: 0.4 10*3/uL (ref 0.1–0.9)
Monocytes: 8 %
NEUTROS PCT: 55 %
Neutrophils Absolute: 2.9 10*3/uL (ref 1.4–7.0)
PLATELETS: 241 10*3/uL (ref 150–379)
RBC: 4.9 x10E6/uL (ref 4.14–5.80)
RDW: 13 % (ref 12.3–15.4)
WBC: 5.3 10*3/uL (ref 3.4–10.8)

## 2017-05-08 LAB — HEPATIC FUNCTION PANEL
ALT: 21 IU/L (ref 0–44)
AST: 20 IU/L (ref 0–40)
Albumin: 4.4 g/dL (ref 3.5–5.5)
Alkaline Phosphatase: 49 IU/L (ref 39–117)
BILIRUBIN TOTAL: 0.4 mg/dL (ref 0.0–1.2)
Bilirubin, Direct: 0.11 mg/dL (ref 0.00–0.40)
Total Protein: 6.6 g/dL (ref 6.0–8.5)

## 2017-05-08 LAB — BASIC METABOLIC PANEL
BUN/Creatinine Ratio: 15 (ref 9–20)
BUN: 14 mg/dL (ref 6–24)
CALCIUM: 9.2 mg/dL (ref 8.7–10.2)
CO2: 25 mmol/L (ref 20–29)
Chloride: 104 mmol/L (ref 96–106)
Creatinine, Ser: 0.91 mg/dL (ref 0.76–1.27)
GFR calc non Af Amer: 93 mL/min/{1.73_m2} (ref >59)
GFR, EST AFRICAN AMERICAN: 108 mL/min/{1.73_m2} (ref >59)
Glucose: 96 mg/dL (ref 65–99)
POTASSIUM: 4.4 mmol/L (ref 3.5–5.2)
Sodium: 142 mmol/L (ref 134–144)

## 2017-05-08 LAB — LIPID PANEL
CHOLESTEROL TOTAL: 171 mg/dL (ref 100–199)
Chol/HDL Ratio: 4 ratio (ref 0.0–5.0)
HDL: 43 mg/dL (ref >39)
LDL CALC: 97 mg/dL (ref 0–99)
TRIGLYCERIDES: 154 mg/dL — AB (ref 0–149)
VLDL CHOLESTEROL CAL: 31 mg/dL (ref 5–40)

## 2017-05-08 LAB — PSA: Prostate Specific Ag, Serum: 0.9 ng/mL (ref 0.0–4.0)

## 2017-05-12 ENCOUNTER — Ambulatory Visit (INDEPENDENT_AMBULATORY_CARE_PROVIDER_SITE_OTHER): Payer: BC Managed Care – PPO | Admitting: Family Medicine

## 2017-05-12 ENCOUNTER — Encounter: Payer: Self-pay | Admitting: Family Medicine

## 2017-05-12 VITALS — BP 138/88 | Ht 68.75 in | Wt 210.0 lb

## 2017-05-12 DIAGNOSIS — Z0000 Encounter for general adult medical examination without abnormal findings: Secondary | ICD-10-CM

## 2017-05-12 NOTE — Patient Instructions (Signed)
DASH Eating Plan DASH stands for "Dietary Approaches to Stop Hypertension." The DASH eating plan is a healthy eating plan that has been shown to reduce high blood pressure (hypertension). It may also reduce your risk for type 2 diabetes, heart disease, and stroke. The DASH eating plan may also help with weight loss. What are tips for following this plan? General guidelines  Avoid eating more than 2,300 mg (milligrams) of salt (sodium) a day. If you have hypertension, you may need to reduce your sodium intake to 1,500 mg a day.  Limit alcohol intake to no more than 1 drink a day for nonpregnant women and 2 drinks a day for men. One drink equals 12 oz of beer, 5 oz of wine, or 1 oz of hard liquor.  Work with your health care provider to maintain a healthy body weight or to lose weight. Ask what an ideal weight is for you.  Get at least 30 minutes of exercise that causes your heart to beat faster (aerobic exercise) most days of the week. Activities may include walking, swimming, or biking.  Work with your health care provider or diet and nutrition specialist (dietitian) to adjust your eating plan to your individual calorie needs. Reading food labels  Check food labels for the amount of sodium per serving. Choose foods with less than 5 percent of the Daily Value of sodium. Generally, foods with less than 300 mg of sodium per serving fit into this eating plan.  To find whole grains, look for the word "whole" as the first word in the ingredient list. Shopping  Buy products labeled as "low-sodium" or "no salt added."  Buy fresh foods. Avoid canned foods and premade or frozen meals. Cooking  Avoid adding salt when cooking. Use salt-free seasonings or herbs instead of table salt or sea salt. Check with your health care provider or pharmacist before using salt substitutes.  Do not fry foods. Cook foods using healthy methods such as baking, boiling, grilling, and broiling instead.  Cook with  heart-healthy oils, such as olive, canola, soybean, or sunflower oil. Meal planning   Eat a balanced diet that includes: ? 5 or more servings of fruits and vegetables each day. At each meal, try to fill half of your plate with fruits and vegetables. ? Up to 6-8 servings of whole grains each day. ? Less than 6 oz of lean meat, poultry, or fish each day. A 3-oz serving of meat is about the same size as a deck of cards. One egg equals 1 oz. ? 2 servings of low-fat dairy each day. ? A serving of nuts, seeds, or beans 5 times each week. ? Heart-healthy fats. Healthy fats called Omega-3 fatty acids are found in foods such as flaxseeds and coldwater fish, like sardines, salmon, and mackerel.  Limit how much you eat of the following: ? Canned or prepackaged foods. ? Food that is high in trans fat, such as fried foods. ? Food that is high in saturated fat, such as fatty meat. ? Sweets, desserts, sugary drinks, and other foods with added sugar. ? Full-fat dairy products.  Do not salt foods before eating.  Try to eat at least 2 vegetarian meals each week.  Eat more home-cooked food and less restaurant, buffet, and fast food.  When eating at a restaurant, ask that your food be prepared with less salt or no salt, if possible. What foods are recommended? The items listed may not be a complete list. Talk with your dietitian about what   dietary choices are best for you. Grains Whole-grain or whole-wheat bread. Whole-grain or whole-wheat pasta. Brown rice. Oatmeal. Quinoa. Bulgur. Whole-grain and low-sodium cereals. Pita bread. Low-fat, low-sodium crackers. Whole-wheat flour tortillas. Vegetables Fresh or frozen vegetables (raw, steamed, roasted, or grilled). Low-sodium or reduced-sodium tomato and vegetable juice. Low-sodium or reduced-sodium tomato sauce and tomato paste. Low-sodium or reduced-sodium canned vegetables. Fruits All fresh, dried, or frozen fruit. Canned fruit in natural juice (without  added sugar). Meat and other protein foods Skinless chicken or turkey. Ground chicken or turkey. Pork with fat trimmed off. Fish and seafood. Egg whites. Dried beans, peas, or lentils. Unsalted nuts, nut butters, and seeds. Unsalted canned beans. Lean cuts of beef with fat trimmed off. Low-sodium, lean deli meat. Dairy Low-fat (1%) or fat-free (skim) milk. Fat-free, low-fat, or reduced-fat cheeses. Nonfat, low-sodium ricotta or cottage cheese. Low-fat or nonfat yogurt. Low-fat, low-sodium cheese. Fats and oils Soft margarine without trans fats. Vegetable oil. Low-fat, reduced-fat, or light mayonnaise and salad dressings (reduced-sodium). Canola, safflower, olive, soybean, and sunflower oils. Avocado. Seasoning and other foods Herbs. Spices. Seasoning mixes without salt. Unsalted popcorn and pretzels. Fat-free sweets. What foods are not recommended? The items listed may not be a complete list. Talk with your dietitian about what dietary choices are best for you. Grains Baked goods made with fat, such as croissants, muffins, or some breads. Dry pasta or rice meal packs. Vegetables Creamed or fried vegetables. Vegetables in a cheese sauce. Regular canned vegetables (not low-sodium or reduced-sodium). Regular canned tomato sauce and paste (not low-sodium or reduced-sodium). Regular tomato and vegetable juice (not low-sodium or reduced-sodium). Pickles. Olives. Fruits Canned fruit in a light or heavy syrup. Fried fruit. Fruit in cream or butter sauce. Meat and other protein foods Fatty cuts of meat. Ribs. Fried meat. Bacon. Sausage. Bologna and other processed lunch meats. Salami. Fatback. Hotdogs. Bratwurst. Salted nuts and seeds. Canned beans with added salt. Canned or smoked fish. Whole eggs or egg yolks. Chicken or turkey with skin. Dairy Whole or 2% milk, cream, and half-and-half. Whole or full-fat cream cheese. Whole-fat or sweetened yogurt. Full-fat cheese. Nondairy creamers. Whipped toppings.  Processed cheese and cheese spreads. Fats and oils Butter. Stick margarine. Lard. Shortening. Ghee. Bacon fat. Tropical oils, such as coconut, palm kernel, or palm oil. Seasoning and other foods Salted popcorn and pretzels. Onion salt, garlic salt, seasoned salt, table salt, and sea salt. Worcestershire sauce. Tartar sauce. Barbecue sauce. Teriyaki sauce. Soy sauce, including reduced-sodium. Steak sauce. Canned and packaged gravies. Fish sauce. Oyster sauce. Cocktail sauce. Horseradish that you find on the shelf. Ketchup. Mustard. Meat flavorings and tenderizers. Bouillon cubes. Hot sauce and Tabasco sauce. Premade or packaged marinades. Premade or packaged taco seasonings. Relishes. Regular salad dressings. Where to find more information:  National Heart, Lung, and Blood Institute: www.nhlbi.nih.gov  American Heart Association: www.heart.org Summary  The DASH eating plan is a healthy eating plan that has been shown to reduce high blood pressure (hypertension). It may also reduce your risk for type 2 diabetes, heart disease, and stroke.  With the DASH eating plan, you should limit salt (sodium) intake to 2,300 mg a day. If you have hypertension, you may need to reduce your sodium intake to 1,500 mg a day.  When on the DASH eating plan, aim to eat more fresh fruits and vegetables, whole grains, lean proteins, low-fat dairy, and heart-healthy fats.  Work with your health care provider or diet and nutrition specialist (dietitian) to adjust your eating plan to your individual   calorie needs. This information is not intended to replace advice given to you by your health care provider. Make sure you discuss any questions you have with your health care provider. Document Released: 04/04/2011 Document Revised: 04/08/2016 Document Reviewed: 04/08/2016 Elsevier Interactive Patient Education  2018 [employer]  

## 2017-05-12 NOTE — Progress Notes (Signed)
   Subjective:    Patient ID: Raymond Rosales, male    DOB: 25-Sep-1960, 57 y.o.   MRN: [id number]  HPI The patient comes in today for a wellness visit.    A review of their health history was completed.  A review of medications was also completed.  Any needed refills; not taking any prescription meds  Eating habits: health conscious  Falls/  MVA accidents in past few months: none  Regular exercise: walk on treadmill 5 days a week for 20 mins at a time  Specialist pt sees on regular basis: none Lab work was reviewed with patient in detail Preventative health issues were discussed.   Additional concerns: none  Blood pressure reading here slightly elevated at 138/88 but blood pressure readings at home look very good  Review of Systems  Constitutional: Negative for activity change, appetite change and fever.  HENT: Negative for congestion and rhinorrhea.   Eyes: Negative for discharge.  Respiratory: Negative for cough and wheezing.   Cardiovascular: Negative for chest pain.  Gastrointestinal: Negative for abdominal pain, blood in stool and vomiting.  Genitourinary: Negative for difficulty urinating and frequency.  Musculoskeletal: Negative for neck pain.  Skin: Negative for rash.  Allergic/Immunologic: Negative for environmental allergies and food allergies.  Neurological: Negative for weakness and headaches.  Psychiatric/Behavioral: Negative for agitation.       Objective:   Physical Exam  Constitutional: He appears well-developed and well-nourished.  HENT:  Head: Normocephalic and atraumatic.  Right Ear: External ear normal.  Left Ear: External ear normal.  Nose: Nose normal.  Mouth/Throat: Oropharynx is clear and moist.  Eyes: EOM are normal. Pupils are equal, round, and reactive to light.  Neck: Normal range of motion. Neck supple. No thyromegaly present.  Cardiovascular: Normal rate, regular rhythm and normal heart sounds.  No murmur heard. Pulmonary/Chest:  Effort normal and breath sounds normal. No respiratory distress. He has no wheezes.  Abdominal: Soft. Bowel sounds are normal. He exhibits no distension and no mass. There is no tenderness.  Genitourinary: Prostate normal and penis normal.  Musculoskeletal: Normal range of motion. He exhibits no edema.  Lymphadenopathy:    He has no cervical adenopathy.  Neurological: He is alert. He exhibits normal muscle tone.  Skin: Skin is warm and dry. No erythema.  Psychiatric: He has a normal mood and affect. His behavior is normal. Judgment normal.   Prostate exam normal testicular exam normal with varicocele  Blood pressure borderline 138/88     Assessment & Plan:  Adult wellness-complete.wellness physical was conducted today. Importance of diet and exercise were discussed in detail. In addition to this a discussion regarding safety was also covered. We also reviewed over immunizations and gave recommendations regarding current immunization needed for age. In addition to this additional areas were also touched on including: Preventative health exams needed: Colonoscopy 2019-has history of adenomatous polyp  Patient was advised yearly wellness exam  Elevated blood pressure- regular physical exercise watching salt check blood pressure on a regular basis the readings he is taking at home have looked very good he will follow-up in 1 year follow-up sooner if any blood pressure issues

## 2017-08-27 ENCOUNTER — Encounter: Payer: Self-pay | Admitting: Internal Medicine

## 2017-09-09 ENCOUNTER — Encounter: Payer: Self-pay | Admitting: Internal Medicine

## 2017-09-30 ENCOUNTER — Encounter: Payer: Self-pay | Admitting: Family Medicine

## 2017-09-30 ENCOUNTER — Ambulatory Visit: Payer: BC Managed Care – PPO | Admitting: Family Medicine

## 2017-09-30 VITALS — BP 136/86 | Temp 97.8°F | Ht 68.75 in | Wt 209.8 lb

## 2017-09-30 DIAGNOSIS — K122 Cellulitis and abscess of mouth: Secondary | ICD-10-CM

## 2017-09-30 DIAGNOSIS — M25562 Pain in left knee: Secondary | ICD-10-CM

## 2017-09-30 MED ORDER — CLINDAMYCIN HCL 300 MG PO CAPS: 300.0000 mg | ORAL_CAPSULE | Freq: Three times a day (TID) | ORAL | 0 refills | Status: DC

## 2017-09-30 NOTE — Progress Notes (Signed)
   Subjective:    Patient ID: Raymond Rosales, male    DOB: 09/25/1960, 57 y.o.   MRN: [id number]  HPI Patient arrives with irritated area on right this been going on for the past several days side of cheek. He relates a sore area on the right cheek region on the inside aspect He does not know of anything he is done does cause trauma to this area denies any dental issues or sore throat fever chills or difficulty swallowing does not use chewing tobacco Patient also having left knee pain intermittent back and left leg pain especially when he squats or when he makes certain movements does not lock or give way has not had any injury to it this been going on for several weeks does a lot of walking   Review of Systems Please see above denies pain abdominal pain chest discomfort shortness of breath fever chills    Objective:   Physical Exam Neck no masses no adenopathy eardrums normal throat he has soft tissue area on the right inner cheek that is consistent with sucking on the side of his mouth or chewing there is no ulcerated area there is no leukoplakia there is some redness around this area that is tender  Knee exam bilateral is normal ligaments normal no swelling noted.  Patient able to squat and stand relates some subjective discomfort behind the knee but no swelling noted       Assessment & Plan:  Knee pain-probably ligamentous or retinaculum recommend anti-inflammatories on a sparing basis otherwise follow-up if progressive troubles may need orthopedic referral if ongoing troubles  Inside of mouth has some mild cellulitis I recommend using Cleocin 3 times daily for the next 7 days follow-up if progressive troubles or worse warning signs discussed if ongoing troubles referral to ENT patient to try to avoid sucking on the side of his mouth-if the area does not improve or if he gets worse definite referral patient aware

## 2017-10-14 ENCOUNTER — Ambulatory Visit (INDEPENDENT_AMBULATORY_CARE_PROVIDER_SITE_OTHER): Payer: Self-pay

## 2017-10-14 DIAGNOSIS — Z86010 Personal history of colonic polyps: Secondary | ICD-10-CM

## 2017-10-14 MED ORDER — NA SULFATE-K SULFATE-MG SULF 17.5-3.13-1.6 GM/177ML PO SOLN: 1.0000 | ORAL | 0 refills | Status: DC

## 2017-10-14 NOTE — Patient Instructions (Signed)
Raymond Rosales  Jul 07, 1960 MRN: [id number]     Procedure Date: 12/17/17 Time to register: 8:30am Place to register: [facility] Procedure Time: 9:30am Scheduled provider: R. Garfield Cornea, MD    PREPARATION FOR COLONOSCOPY WITH SUPREP BOWEL PREP KIT  Note: Suprep Bowel Prep Kit is a split-dose (2day) regimen. Consumption of BOTH 6-ounce bottles is required for a complete prep.  Please notify us immediately if you are diabetic, take iron supplements, or if you are on Coumadin or any other blood thinners.                                                                                                                                                    2 DAYS BEFORE PROCEDURE:  DATE: 12/15/17   DAY: Monday Begin clear liquid diet AFTER your lunch meal. NO SOLID FOODS after this point.  1 DAY BEFORE PROCEDURE:  DATE: 12/16/17  DAY: Tuesday Continue clear liquids the entire day - NO SOLID FOOD.     At 6:00pm: Complete steps 1 through 4 below, using ONE (1) 6-ounce bottle, before going to bed. Step 1:  Pour ONE (1) 6-ounce bottle of SUPREP liquid into the mixing container.  Step 2:  Add cool drinking water to the 16 ounce line on the container and mix.  Note: Dilute the solution concentrate as directed prior to use. Step 3:  DRINK ALL the liquid in the container. Step 4:  You MUST drink an additional two (2) or more 16 ounce containers of water over the next one (1) hour.   Continue clear liquids only EXCEPTION: If you take medications for your heart, blood pressure, or breathing, you may take these medications with a small amount of clear liquid.   DAY OF PROCEDURE:   DATE: 12/17/17   DAY: Wednesday    5 hours before your procedure at 4:30am: Step 1:  Pour ONE (1) 6-ounce bottle of SUPREP liquid into the mixing container.  Step 2:  Add cool drinking water to the 16 ounce line on the container and mix.  Note: Dilute the solution concentrate as directed prior to use. Step  3:  DRINK ALL the liquid in the container. Step 4:  You MUST drink an additional two (2) or more 16 ounce containers of water over the next one (1) hour. You MUST complete the final glass of water at least 3 hours before your colonoscopy.   Nothing by mouth past 6:30am  You may take your morning medications with sip of water unless we have instructed otherwise.    Please see below for Dietary Information.  CLEAR LIQUIDS INCLUDE:  Water Jello (NOT red in color)   Ice Popsicles (NOT red in color)   Tea (sugar ok, no milk/cream) Powdered fruit flavored drinks  Coffee (sugar ok, no milk/cream) Gatorade/ Lemonade/ Kool-Aid  (NOT  red in color)   Juice: apple, white grape, white cranberry Soft drinks  Clear bullion, consomme, broth (fat free beef/chicken/vegetable)  Carbonated beverages (any kind)  Strained chicken noodle soup Hard Candy   Remember: Clear liquids are liquids that will allow you to see your fingers on the other side of a clear glass. Be sure liquids are NOT red in color, and not cloudy, but CLEAR.  DO NOT EAT OR DRINK ANY OF THE FOLLOWING:  Dairy products of any kind   Cranberry juice Tomato juice / V8 juice   Grapefruit juice Orange juice     Red grape juice  Do not eat any solid foods, including such foods as: cereal, oatmeal, yogurt, fruits, vegetables, creamed soups, eggs, bread, crackers, pureed foods in a blender, etc.   HELPFUL HINTS FOR DRINKING PREP SOLUTION:   Make sure prep is extremely cold. Mix and refrigerate the the morning of the prep. You may also put in the freezer.   You may try mixing some Crystal Light or Country Time Lemonade if you prefer. Mix in small amounts; add more if necessary.  Try drinking through a straw  Rinse mouth with water or a mouthwash between glasses, to remove after-taste.  Try sipping on a cold beverage /ice/ popsicles between glasses of prep.  Place a piece of sugar-free hard candy in mouth between glasses.  If you become  nauseated, try consuming smaller amounts, or stretch out the time between glasses. Stop for 30-60 minutes, then slowly start back drinking.     OTHER INSTRUCTIONS  You will need a responsible adult at least 58 years of age to accompany you and drive you home. This person must remain in the waiting room during your procedure. The hospital will cancel your procedure if you do not have a responsible adult with you.   1. Wear loose fitting clothing that is easily removed. 2. Leave jewelry and other valuables at home.  3. Remove all body piercing jewelry and leave at home. 4. Total time from sign-in until discharge is approximately 2-3 hours. 5. You should go home directly after your procedure and rest. You can resume normal activities the day after your procedure. 6. The day of your procedure you should not:  Drive  Make legal decisions  Operate machinery  Drink alcohol  Return to work   You may call the office (Dept: [phone]) before 5:00pm, or page the doctor on call [phone]) after 5:00pm, for further instructions, if necessary.   Insurance Information YOU WILL NEED TO CHECK WITH YOUR INSURANCE COMPANY FOR THE BENEFITS OF COVERAGE YOU HAVE FOR THIS PROCEDURE.  UNFORTUNATELY, NOT ALL INSURANCE COMPANIES HAVE BENEFITS TO COVER ALL OR PART OF THESE TYPES OF PROCEDURES.  IT IS YOUR RESPONSIBILITY TO CHECK YOUR BENEFITS, HOWEVER, WE WILL BE GLAD TO ASSIST YOU WITH ANY CODES YOUR INSURANCE COMPANY MAY NEED.    PLEASE NOTE THAT MOST INSURANCE COMPANIES WILL NOT COVER A SCREENING COLONOSCOPY FOR PEOPLE UNDER THE AGE OF 50  IF YOU HAVE BCBS INSURANCE, YOU MAY HAVE BENEFITS FOR A SCREENING COLONOSCOPY BUT IF POLYPS ARE FOUND THE DIAGNOSIS WILL CHANGE AND THEN YOU MAY HAVE A DEDUCTIBLE THAT WILL NEED TO BE MET. SO PLEASE MAKE SURE YOU CHECK YOUR BENEFITS FOR A SCREENING COLONOSCOPY AS WELL AS A DIAGNOSTIC COLONOSCOPY.

## 2017-10-14 NOTE — Progress Notes (Signed)
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 Diabetes Melitis: no 2. Joint replacements in the past 12 months: no 3. Major health problems in the past 3 months: no 4. Has an artificial valve or MVP: no 5. Has a defibrillator: no 6. Has been advised in past to take antibiotics in advance of a procedure like teeth cleaning: no 7. Family history of colon cancer: no  8. Alcohol Use: no 9. History of sleep apnea: no  10. History of coronary artery or other vascular stents placed within the last 12 months: no 11. History of any prior anesthesia complications: no    MEDICATIONS & ALLERGIES:    Patient reports the following regarding taking any blood thinners:   Plavix? no Aspirin? no Coumadin? no Brilinta? no Xarelto? no Eliquis? no Pradaxa? no Savaysa? no Effient? no  Patient confirms/reports the following medications:  Current Outpatient Medications  Medication Sig Dispense Refill  . Multiple Vitamin (MULTIVITAMIN) tablet Take 1 tablet by mouth daily.     No current facility-administered medications for this visit.     Patient confirms/reports the following allergies:  Allergies  Allergen Reactions  . Penicillins Rash    No orders of the defined types were placed in this encounter.   AUTHORIZATION INFORMATION Primary Insurance: [place] ,  [place] #: [id number] Pre-Cert / Josem Kaufmann required: no   SCHEDULE INFORMATION: Procedure has been scheduled as follows:  Date: 12/17/17, Time: 9:30 Location: [facility]  This Gastroenterology Pre-Precedure Review Form is being routed to the following provider(s): [facility]

## 2017-10-24 NOTE — Progress Notes (Signed)
Ok to schedule.

## 2017-12-17 ENCOUNTER — Ambulatory Visit (HOSPITAL_COMMUNITY)
Admission: RE | Admit: 2017-12-17 | Discharge: 2017-12-17 | Disposition: A | Discharge status: Home / Self Care | Payer: BC Managed Care – PPO | Source: Ambulatory Visit | Attending: Internal Medicine | Admitting: Internal Medicine

## 2017-12-17 ENCOUNTER — Encounter (HOSPITAL_COMMUNITY)
Admission: RE | Disposition: A | Discharge status: Home / Self Care | Payer: Self-pay | Source: Ambulatory Visit | Attending: Internal Medicine

## 2017-12-17 ENCOUNTER — Encounter (HOSPITAL_COMMUNITY): Payer: Self-pay | Admitting: *Deleted

## 2017-12-17 ENCOUNTER — Other Ambulatory Visit: Payer: Self-pay

## 2017-12-17 DIAGNOSIS — D123 Benign neoplasm of transverse colon: Secondary | ICD-10-CM | POA: Diagnosis not present

## 2017-12-17 DIAGNOSIS — Z1211 Encounter for screening for malignant neoplasm of colon: Secondary | ICD-10-CM | POA: Insufficient documentation

## 2017-12-17 DIAGNOSIS — D122 Benign neoplasm of ascending colon: Secondary | ICD-10-CM

## 2017-12-17 DIAGNOSIS — Z86010 Personal history of colonic polyps: Secondary | ICD-10-CM | POA: Insufficient documentation

## 2017-12-17 DIAGNOSIS — Z880 Allergy status to penicillin: Secondary | ICD-10-CM | POA: Diagnosis not present

## 2017-12-17 DIAGNOSIS — Z8249 Family history of ischemic heart disease and other diseases of the circulatory system: Secondary | ICD-10-CM | POA: Insufficient documentation

## 2017-12-17 DIAGNOSIS — D124 Benign neoplasm of descending colon: Secondary | ICD-10-CM | POA: Diagnosis not present

## 2017-12-17 DIAGNOSIS — Z79899 Other long term (current) drug therapy: Secondary | ICD-10-CM | POA: Diagnosis not present

## 2017-12-17 DIAGNOSIS — K5730 Diverticulosis of large intestine without perforation or abscess without bleeding: Secondary | ICD-10-CM | POA: Insufficient documentation

## 2017-12-17 HISTORY — PX: COLONOSCOPY: SHX5424

## 2017-12-17 HISTORY — PX: POLYPECTOMY: SHX5525

## 2017-12-17 SURGERY — COLONOSCOPY
Anesthesia: Moderate Sedation

## 2017-12-17 MED ORDER — MEPERIDINE HCL 50 MG/ML IJ SOLN
INTRAMUSCULAR | Status: AC
  Filled 2017-12-17: qty 1

## 2017-12-17 MED ORDER — MIDAZOLAM HCL 5 MG/5ML IJ SOLN
INTRAMUSCULAR | Status: AC
  Filled 2017-12-17: qty 5

## 2017-12-17 MED ORDER — ONDANSETRON HCL 4 MG/2ML IJ SOLN
INTRAMUSCULAR | Status: DC | PRN
  Administered 2017-12-17: 4 mg via INTRAVENOUS

## 2017-12-17 MED ORDER — MIDAZOLAM HCL 5 MG/5ML IJ SOLN
INTRAMUSCULAR | Status: DC | PRN
  Administered 2017-12-17: 1 mg via INTRAVENOUS
  Administered 2017-12-17: 2 mg via INTRAVENOUS
  Administered 2017-12-17: 1 mg via INTRAVENOUS
  Administered 2017-12-17: 2 mg via INTRAVENOUS
  Administered 2017-12-17: 1 mg via INTRAVENOUS

## 2017-12-17 MED ORDER — SODIUM CHLORIDE 0.9 % IV SOLN
INTRAVENOUS | Status: DC
  Administered 2017-12-17: 1000 mL via INTRAVENOUS

## 2017-12-17 MED ORDER — ONDANSETRON HCL 4 MG/2ML IJ SOLN
INTRAMUSCULAR | Status: AC
  Filled 2017-12-17: qty 2

## 2017-12-17 MED ORDER — MEPERIDINE HCL 100 MG/ML IJ SOLN
INTRAMUSCULAR | Status: DC | PRN
  Administered 2017-12-17 (×2): 25 mg via INTRAVENOUS

## 2017-12-17 NOTE — Discharge Instructions (Signed)
Colonoscopy Discharge Instructions  Read the instructions outlined below and refer to this sheet in the next few weeks. These discharge instructions provide you with general information on caring for yourself after you leave the hospital. Your doctor may also give you specific instructions. While your treatment has been planned according to the most current medical practices available, unavoidable complications occasionally occur. If you have any problems or questions after discharge, call Dr. Gala Romney at [phone]. ACTIVITY  You may resume your regular activity, but move at a slower pace for the next 24 hours.   Take frequent rest periods for the next 24 hours.   Walking will help get rid of the air and reduce the bloated feeling in your belly (abdomen).   No driving for 24 hours (because of the medicine (anesthesia) used during the test).    Do not sign any important legal documents or operate any machinery for 24 hours (because of the anesthesia used during the test).  NUTRITION  Drink plenty of fluids.   You may resume your normal diet as instructed by your doctor.   Begin with a light meal and progress to your normal diet. Heavy or fried foods are harder to digest and may make you feel sick to your stomach (nauseated).   Avoid alcoholic beverages for 24 hours or as instructed.  MEDICATIONS  You may resume your normal medications unless your doctor tells you otherwise.  WHAT YOU CAN EXPECT TODAY  Some feelings of bloating in the abdomen.   Passage of more gas than usual.   Spotting of blood in your stool or on the toilet paper.  IF YOU HAD POLYPS REMOVED DURING THE COLONOSCOPY:  No aspirin products for 7 days or as instructed.   No alcohol for 7 days or as instructed.   Eat a soft diet for the next 24 hours.  FINDING OUT THE RESULTS OF YOUR TEST Not all test results are available during your visit. If your test results are not back during the visit, make an appointment  with your caregiver to find out the results. Do not assume everything is normal if you have not heard from your caregiver or the medical facility. It is important for you to follow up on all of your test results.  SEEK IMMEDIATE MEDICAL ATTENTION IF:  You have more than a spotting of blood in your stool.   Your belly is swollen (abdominal distention).   You are nauseated or vomiting.   You have a temperature over 101.   You have abdominal pain or discomfort that is severe or gets worse throughout the day.    Diverticulosis and colon polyp information provided  Further recommendations to follow pending review of pathology report   Colon Polyps Polyps are tissue growths inside the body. Polyps can grow in many places, including the large intestine (colon). A polyp may be a round bump or a mushroom-shaped growth. You could have one polyp or several. Most colon polyps are noncancerous (benign). However, some colon polyps can become cancerous over time. What are the causes? The exact cause of colon polyps is not known. What increases the risk? This condition is more likely to develop in people who:  Have a family history of colon cancer or colon polyps.  Are older than 35 or older than 45 if they are African American.  Have inflammatory bowel disease, such as ulcerative colitis or Crohn disease.  Are overweight.  Smoke cigarettes.  Do not get enough exercise.  Drink too  much alcohol.  Eat a diet that is: ? High in fat and red meat. ? Low in fiber.  Had childhood cancer that was treated with abdominal radiation.  What are the signs or symptoms? Most polyps do not cause symptoms. If you have symptoms, they may include:  Blood coming from your rectum when having a bowel movement.  Blood in your stool.The stool may look dark red or black.  A change in bowel habits, such as constipation or diarrhea.  How is this diagnosed? This condition is diagnosed with a  colonoscopy. This is a procedure that uses a lighted, flexible scope to look at the inside of your colon. How is this treated? Treatment for this condition involves removing any polyps that are found. Those polyps will then be tested for cancer. If cancer is found, your health care provider will talk to you about options for colon cancer treatment. Follow these instructions at home: Diet  Eat plenty of fiber, such as fruits, vegetables, and whole grains.  Eat foods that are high in calcium and vitamin D, such as milk, cheese, yogurt, eggs, liver, fish, and broccoli.  Limit foods high in fat, red meats, and processed meats, such as hot dogs, sausage, bacon, and lunch meats.  Maintain a healthy weight, or lose weight if recommended by your health care provider. General instructions  Do not smoke cigarettes.  Do not drink alcohol excessively.  Keep all follow-up visits as told by your health care provider. This is important. This includes keeping regularly scheduled colonoscopies. Talk to your health care provider about when you need a colonoscopy.  Exercise every day or as told by your health care provider. Contact a health care provider if:  You have new or worsening bleeding during a bowel movement.  You have new or increased blood in your stool.  You have a change in bowel habits.  You unexpectedly lose weight. This information is not intended to replace advice given to you by your health care provider. Make sure you discuss any questions you have with your health care provider.    Diverticulosis Diverticulosis is a condition that develops when small pouches (diverticula) form in the wall of the large intestine (colon). The colon is where water is absorbed and stool is formed. The pouches form when the inside layer of the colon pushes through weak spots in the outer layers of the colon. You may have a few pouches or many of them. What are the causes? The cause of this condition  is not known. What increases the risk? The following factors may make you more likely to develop this condition:  Being older than age 62. Your risk for this condition increases with age. Diverticulosis is rare among people younger than age 65. By age 4, many people have it.  Eating a low-fiber diet.  Having frequent constipation.  Being overweight.  Not getting enough exercise.  Smoking.  Taking over-the-counter pain medicines, like aspirin and ibuprofen.  Having a family history of diverticulosis.  What are the signs or symptoms? In most people, there are no symptoms of this condition. If you do have symptoms, they may include:  Bloating.  Cramps in the abdomen.  Constipation or diarrhea.  Pain in the lower left side of the abdomen.  How is this diagnosed? This condition is most often diagnosed during an exam for other colon problems. Because diverticulosis usually has no symptoms, it often cannot be diagnosed independently. This condition may be diagnosed by:  Using a  flexible scope to examine the colon (colonoscopy).  Taking an X-ray of the colon after dye has been put into the colon (barium enema).  Doing a CT scan.  How is this treated? You may not need treatment for this condition if you have never developed an infection related to diverticulosis. If you have had an infection before, treatment may include:  Eating a high-fiber diet. This may include eating more fruits, vegetables, and grains.  Taking a fiber supplement.  Taking a live bacteria supplement (probiotic).  Taking medicine to relax your colon.  Taking antibiotic medicines.  Follow these instructions at home:  Drink 6-8 glasses of water or more each day to prevent constipation.  Try not to strain when you have a bowel movement.  If you have had an infection before: ? Eat more fiber as directed by your health care provider or your diet and nutrition specialist (dietitian). ? Take a fiber  supplement or probiotic, if your health care provider approves.  Take over-the-counter and prescription medicines only as told by your health care provider.  If you were prescribed an antibiotic, take it as told by your health care provider. Do not stop taking the antibiotic even if you start to feel better.  Keep all follow-up visits as told by your health care provider. This is important. Contact a health care provider if:  You have pain in your abdomen.  You have bloating.  You have cramps.  You have not had a bowel movement in 3 days. Get help right away if:  Your pain gets worse.  Your bloating becomes very bad.  You have a fever or chills, and your symptoms suddenly get worse.  You vomit.  You have bowel movements that are bloody or black.  You have bleeding from your rectum. Summary  Diverticulosis is a condition that develops when small pouches (diverticula) form in the wall of the large intestine (colon).  You may have a few pouches or many of them.  This condition is most often diagnosed during an exam for other colon problems.  If you have had an infection related to diverticulosis, treatment may include increasing the fiber in your diet, taking supplements, or taking medicines. This information is not intended to replace advice given to you by your health care provider. Make sure you discuss any questions you have with your health care provider.  PATIENT INSTRUCTIONS POST-ANESTHESIA  IMMEDIATELY FOLLOWING SURGERY:  Do not drive or operate machinery for the first twenty four hours after surgery.  Do not make any important decisions for twenty four hours after surgery or while taking narcotic pain medications or sedatives.  If you develop intractable nausea and vomiting or a severe headache please notify your doctor immediately.  FOLLOW-UP:  Please make an appointment with your surgeon as instructed. You do not need to follow up with anesthesia unless  specifically instructed to do so.  WOUND CARE INSTRUCTIONS (if applicable):  Keep a dry clean dressing on the anesthesia/puncture wound site if there is drainage.  Once the wound has quit draining you may leave it open to air.  Generally you should leave the bandage intact for twenty four hours unless there is drainage.  If the epidural site drains for more than 36-48 hours please call the anesthesia department.  QUESTIONS?:  Please feel free to call your physician or the hospital operator if you have any questions, and they will be happy to assist you.

## 2017-12-17 NOTE — Op Note (Signed)
[facility] Patient Name: Raymond Rosales Procedure Date: 12/17/2017 9:45 AM MRN: [id number] Date of Birth: Jun 19, 1960 Attending MD: Norvel Richards , MD CSN: [id number] Age: 57 Admit Type: Outpatient Procedure:                Colonoscopy Indications:              High risk colon cancer surveillance: Personal                            history of colonic polyps Providers:                Norvel Richards, MD, Lurline Del, RN, Aram Candela Referring MD:              Medicines:                Midazolam mg IV Complications:            No immediate complications. Estimated Blood Loss:     Estimated blood loss was minimal. Procedure:                Pre-Anesthesia Assessment:                           - Prior to the procedure, a History and Physical                            was performed, and patient medications and                            allergies were reviewed. The patient's tolerance of                            previous anesthesia was also reviewed. The risks                            and benefits of the procedure and the sedation                            options and risks were discussed with the patient.                            All questions were answered, and informed consent                            was obtained. Prior Anticoagulants: The patient has                            taken no previous anticoagulant or antiplatelet                            agents. ASA Grade Assessment: II - A patient with  mild systemic disease. After reviewing the risks                            and benefits, the patient was deemed in                            satisfactory condition to undergo the procedure.                           After obtaining informed consent, the colonoscope                            was passed under direct vision. Throughout the                            procedure, the patient's blood pressure,  pulse, and                            oxygen saturations were monitored continuously. The                            CF-HQ190L ([id number]) scope was introduced through                            the anus and advanced to the the cecum, identified                            by appendiceal orifice and ileocecal valve. The                            colonoscopy was performed without difficulty. The                            patient tolerated the procedure well. The quality                            of the bowel preparation was adequate. The                            ileocecal valve, appendiceal orifice, and rectum                            were photographed. Scope In: 10:17:29 AM Scope Out: 10:35:22 AM Scope Withdrawal Time: 0 hours 10 minutes 43 seconds  Total Procedure Duration: 0 hours 17 minutes 53 seconds  Findings:      The perianal and digital rectal examinations were normal.      Two sessile polyps were found in the descending colon and ascending       colon. The polyps were 4 to 6 mm in size. These polyps were removed with       a cold snare. Resection and retrieval were complete. Estimated blood       loss was minimal. Estimated blood loss was minimal.      Multiple small and large-mouthed diverticula were found in the sigmoid  colon and descending colon.      The exam was otherwise without abnormality on direct and retroflexion       views. Impression:               - Two 4 to 6 mm polyps in the descending colon and                            in the ascending colon, removed with a cold snare.                            Resected and retrieved.                           - Diverticulosis in the sigmoid colon and in the                            descending colon.                           - The examination was otherwise normal on direct                            and retroflexion views. Moderate Sedation:      Moderate (conscious) sedation was administered by the endoscopy  nurse       and supervised by the endoscopist. The following parameters were       monitored: oxygen saturation, heart rate, blood pressure, respiratory       rate, EKG, adequacy of pulmonary ventilation, and response to care.       Total physician intraservice time was 23 minutes. Recommendation:           - Written discharge instructions were provided to                            the patient.                           - The signs and symptoms of potential delayed                            complications were discussed with the patient.                           - Patient has a contact number available for                            emergencies.                           - Return to normal activities tomorrow.                           - Advance diet as tolerated.                           - Repeat colonoscopy date to be determined after  pending pathology results are reviewed for                            surveillance.                           - Return to GI office PRN. Procedure Code(s):        --- Professional ---                           [phone], Colonoscopy, flexible; with removal of                            tumor(s), polyp(s), or other lesion(s) by snare                            technique                           G0500, Moderate sedation services provided by the                            same physician or other qualified health care                            professional performing a gastrointestinal                            endoscopic service that sedation supports,                            requiring the presence of an independent trained                            observer to assist in the monitoring of the                            patient's level of consciousness and physiological                            status; initial 15 minutes of intra-service time;                            patient age 88 years or older (additional time may                             be reported with [phone], as appropriate)                           [phone], Moderate sedation services provided by the                            same physician or other qualified health care  professional performing the diagnostic or                            therapeutic service that the sedation supports,                            requiring the presence of an independent trained                            observer to assist in the monitoring of the                            patient's level of consciousness and physiological                            status; each additional 15 minutes intraservice                            time (List separately in addition to code for                            primary service) Diagnosis Code(s):        --- Professional ---                           Z86.010, Personal history of colonic polyps                           D12.4, Benign neoplasm of descending colon                           D12.2, Benign neoplasm of ascending colon                           K57.30, Diverticulosis of large intestine without                            perforation or abscess without bleeding CPT copyright 2017 American Medical Association. All rights reserved. The codes documented in this report are preliminary and upon coder review may  be revised to meet current compliance requirements. Cristopher Estimable. Donoven Pett, MD Norvel Richards, MD 12/17/2017 10:41:11 AM This report has been signed electronically. Number of Addenda: 0

## 2017-12-17 NOTE — H&P (Signed)
$'@LOGO'v$ @   Primary Care Physician:  Kathyrn Drown, MD Primary Gastroenterologist:  Dr. Gala Romney  Pre-Procedure History & Physical: HPI:  Raymond Rosales is a 57 y.o. male here for surveillance colonoscopy. History of advanced adenoma;  Negative colonoscopy 5 years   Past Medical History:  Diagnosis Date  . History of colon polyps 2011  . Reflux   . Seasonal allergies     Past Surgical History:  Procedure Laterality Date  . COLONOSCOPY N/A 09/22/2012   Procedure: COLONOSCOPY;  Surgeon: Daneil Dolin, MD;  Location: [facility];  Service: Endoscopy;  Laterality: N/A;  7:30 AM  . TONSILLECTOMY     17  yrs of age    Prior to Admission medications   Medication Sig Start Date End Date Taking? Authorizing Provider  Multiple Vitamin (MULTIVITAMIN) tablet Take 1 tablet by mouth daily.   Yes [provider]  Na Sulfate-K Sulfate-Mg Sulf (SUPREP BOWEL PREP KIT) 17.5-3.13-1.6 GM/177ML SOLN Take 1 kit by mouth as directed. 10/14/17  Yes Annitta Needs, NP  Polyethyl Glycol-Propyl Glycol (SYSTANE OP) Place 1 drop into both eyes daily as needed (dry eyes).   Yes [provider]    Allergies as of 10/14/2017 - Review Complete 10/14/2017  Allergen Reaction Noted  . Penicillins Rash 09/02/2012    Family History  Problem Relation Age of Onset  . CAD Father   . Heart attack Father   . Diabetes type II Father     Social History   Socioeconomic History  . Marital status: Married    Spouse name: Not on file  . Number of children: Not on file  . Years of education: Not on file  . Highest education level: Not on file  Occupational History  . Not on file  Social Needs  . Financial resource strain: Not on file  . Food insecurity:    Worry: Not on file    Inability: Not on file  . Transportation needs:    Medical: Not on file    Non-medical: Not on file  Tobacco Use  . Smoking status: Never Smoker  . Smokeless tobacco: Never Used  Substance and Sexual Activity  .  Alcohol use: Yes    Comment: occasional social  . Drug use: No  . Sexual activity: Yes  Lifestyle  . Physical activity:    Days per week: Not on file    Minutes per session: Not on file  . Stress: Not on file  Relationships  . Social connections:    Talks on phone: Not on file    Gets together: Not on file    Attends religious service: Not on file    Active member of club or organization: Not on file    Attends meetings of clubs or organizations: Not on file    Relationship status: Not on file  . Intimate partner violence:    Fear of current or ex partner: Not on file    Emotionally abused: Not on file    Physically abused: Not on file    Forced sexual activity: Not on file  Other Topics Concern  . Not on file  Social History Narrative  . Not on file    Review of Systems: See HPI, otherwise negative ROS  Physical Exam: BP (!) 155/88   Pulse 65   Temp 98.3 F (36.8 C) (Oral)   Resp 15   Ht '5\' 9"'$  (1.753 m)   Wt 90.7 kg   SpO2 100%  BMI 29.53 kg/m  General:   Alert,  Well-developed, well-nourished, pleasant and cooperative in NAD SNeck:  Supple; no masses or thyromegaly. No significant cervical adenopathy. Lungs:  Clear throughout to auscultation.   No wheezes, crackles, or rhonchi. No acute distress. Heart:  Regular rate and rhythm; no murmurs, clicks, rubs,  or gallops. Abdomen: Non-distended, normal bowel sounds.  Soft and nontender without appreciable mass or hepatosplenomegaly.  Pulses:  Normal pulses noted. Extremities:  Without clubbing or edema.  Impression/Plan: Pleasant 56 year old gentleman here for surveillance TCS.  The risks, benefits, limitations, alternatives and imponderables have been reviewed with the patient. Questions have been answered. All parties are agreeable.      Notice: This dictation was prepared with Dragon dictation along with smaller phrase technology. Any transcriptional errors that result from this process are unintentional and  may not be corrected upon review.

## 2017-12-22 ENCOUNTER — Encounter: Payer: Self-pay | Admitting: Internal Medicine

## 2017-12-22 ENCOUNTER — Encounter (HOSPITAL_COMMUNITY): Payer: Self-pay | Admitting: Internal Medicine

## 2018-01-23 ENCOUNTER — Other Ambulatory Visit: Payer: Self-pay | Admitting: Family Medicine

## 2018-01-23 ENCOUNTER — Telehealth: Payer: Self-pay | Admitting: Family Medicine

## 2018-01-23 MED ORDER — CLINDAMYCIN HCL 300 MG PO CAPS: ORAL_CAPSULE | ORAL | 0 refills | Status: DC

## 2018-01-23 NOTE — Telephone Encounter (Signed)
Mr. Raymond Rosales came in back in June for a rough / raw feeling in the inside of his mouth. Dr. Nicki Reaper prescribed a medication for it. The pt didn't finish the medication like he should have and two weeks ago the symptoms started back up again. He finished what he had left of the medication which was three days worth. Now it is starting back up again and would like a new script sent in of that medication to [place], [place]. He does have a hearing at 3 today he said it is fine to LVM and he will call back the hearing shouldn't take to long.

## 2018-01-23 NOTE — Telephone Encounter (Signed)
Please advise 

## 2018-01-23 NOTE — Telephone Encounter (Signed)
Cleocin 300 mg 1 3 times daily for 7 days if ongoing troubles recommend follow-up

## 2018-01-23 NOTE — Telephone Encounter (Signed)
Medication sent in and patient is aware.  

## 2018-05-11 ENCOUNTER — Telehealth: Payer: Self-pay | Admitting: Family Medicine

## 2018-05-11 DIAGNOSIS — Z79899 Other long term (current) drug therapy: Secondary | ICD-10-CM

## 2018-05-11 DIAGNOSIS — I10 Essential (primary) hypertension: Secondary | ICD-10-CM

## 2018-05-11 DIAGNOSIS — Z125 Encounter for screening for malignant neoplasm of prostate: Secondary | ICD-10-CM

## 2018-05-11 DIAGNOSIS — Z1159 Encounter for screening for other viral diseases: Secondary | ICD-10-CM

## 2018-05-11 DIAGNOSIS — Z13220 Encounter for screening for lipoid disorders: Secondary | ICD-10-CM

## 2018-05-11 DIAGNOSIS — Z114 Encounter for screening for human immunodeficiency virus [HIV]: Secondary | ICD-10-CM

## 2018-05-11 NOTE — Telephone Encounter (Signed)
Lipid, liver, metabolic 7, CBC, PSA  Also Centers for Disease Control recommends HIV antibody, hepatitis C antibody screening

## 2018-05-11 NOTE — Telephone Encounter (Signed)
Patient is aware 

## 2018-05-11 NOTE — Telephone Encounter (Signed)
Labs placed and the pt is aware.

## 2018-05-11 NOTE — Telephone Encounter (Signed)
Last labs 04/2017: Lipid, Liver, Met 7 PSA, CBC

## 2018-05-11 NOTE — Telephone Encounter (Signed)
Pt has an upcoming physical on 05/21/18 with Dr.Scott, requesting blood work.

## 2018-05-14 ENCOUNTER — Encounter: Payer: BC Managed Care – PPO | Admitting: Family Medicine

## 2018-05-14 LAB — HM DIABETES EYE EXAM

## 2018-05-15 LAB — BASIC METABOLIC PANEL
BUN/Creatinine Ratio: 13 (ref 9–20)
BUN: 11 mg/dL (ref 6–24)
CALCIUM: 9.1 mg/dL (ref 8.7–10.2)
CHLORIDE: 101 mmol/L (ref 96–106)
CO2: 26 mmol/L (ref 20–29)
Creatinine, Ser: 0.87 mg/dL (ref 0.76–1.27)
GFR calc non Af Amer: 95 mL/min/{1.73_m2} (ref >59)
GFR, EST AFRICAN AMERICAN: 110 mL/min/{1.73_m2} (ref >59)
Glucose: 89 mg/dL (ref 65–99)
Potassium: 4.2 mmol/L (ref 3.5–5.2)
SODIUM: 140 mmol/L (ref 134–144)

## 2018-05-15 LAB — LIPID PANEL
CHOLESTEROL TOTAL: 196 mg/dL (ref 100–199)
Chol/HDL Ratio: 4.6 ratio (ref 0.0–5.0)
HDL: 43 mg/dL (ref >39)
LDL Calculated: 129 mg/dL — ABNORMAL HIGH (ref 0–99)
TRIGLYCERIDES: 120 mg/dL (ref 0–149)
VLDL CHOLESTEROL CAL: 24 mg/dL (ref 5–40)

## 2018-05-15 LAB — HEPATIC FUNCTION PANEL
ALT: 18 IU/L (ref 0–44)
AST: 16 IU/L (ref 0–40)
Albumin: 4.3 g/dL (ref 3.5–5.5)
Alkaline Phosphatase: 56 IU/L (ref 39–117)
BILIRUBIN TOTAL: 0.5 mg/dL (ref 0.0–1.2)
Bilirubin, Direct: 0.13 mg/dL (ref 0.00–0.40)
Total Protein: 6.4 g/dL (ref 6.0–8.5)

## 2018-05-15 LAB — CBC WITH DIFFERENTIAL/PLATELET
BASOS: 1 %
Basophils Absolute: 0 10*3/uL (ref 0.0–0.2)
EOS (ABSOLUTE): 0 10*3/uL (ref 0.0–0.4)
EOS: 1 %
HEMATOCRIT: 45.8 % (ref 37.5–51.0)
HEMOGLOBIN: 15.2 g/dL (ref 13.0–17.7)
Immature Grans (Abs): 0 10*3/uL (ref 0.0–0.1)
Immature Granulocytes: 0 %
LYMPHS ABS: 1.7 10*3/uL (ref 0.7–3.1)
Lymphs: 35 %
MCH: 30.3 pg (ref 26.6–33.0)
MCHC: 33.2 g/dL (ref 31.5–35.7)
MCV: 91 fL (ref 79–97)
MONOCYTES: 7 %
MONOS ABS: 0.3 10*3/uL (ref 0.1–0.9)
NEUTROS ABS: 2.7 10*3/uL (ref 1.4–7.0)
Neutrophils: 56 %
Platelets: 262 10*3/uL (ref 150–450)
RBC: 5.01 x10E6/uL (ref 4.14–5.80)
RDW: 12 % (ref 11.6–15.4)
WBC: 4.8 10*3/uL (ref 3.4–10.8)

## 2018-05-15 LAB — HIV ANTIBODY (ROUTINE TESTING W REFLEX): HIV SCREEN 4TH GENERATION: NONREACTIVE

## 2018-05-15 LAB — PSA: Prostate Specific Ag, Serum: 0.8 ng/mL (ref 0.0–4.0)

## 2018-05-15 LAB — HEPATITIS C ANTIBODY: HEP C VIRUS AB: 0.1 {s_co_ratio} (ref 0.0–0.9)

## 2018-05-21 ENCOUNTER — Encounter: Payer: Self-pay | Admitting: Family Medicine

## 2018-05-21 ENCOUNTER — Ambulatory Visit (INDEPENDENT_AMBULATORY_CARE_PROVIDER_SITE_OTHER): Payer: BC Managed Care – PPO | Admitting: Family Medicine

## 2018-05-21 VITALS — BP 130/86 | Ht 68.75 in | Wt 200.1 lb

## 2018-05-21 DIAGNOSIS — E785 Hyperlipidemia, unspecified: Secondary | ICD-10-CM | POA: Diagnosis not present

## 2018-05-21 DIAGNOSIS — M7582 Other shoulder lesions, left shoulder: Secondary | ICD-10-CM

## 2018-05-21 DIAGNOSIS — I10 Essential (primary) hypertension: Secondary | ICD-10-CM

## 2018-05-21 DIAGNOSIS — Z0000 Encounter for general adult medical examination without abnormal findings: Secondary | ICD-10-CM | POA: Diagnosis not present

## 2018-05-21 MED ORDER — AMLODIPINE BESYLATE 2.5 MG PO TABS: 2.5000 mg | ORAL_TABLET | Freq: Every day | ORAL | 5 refills | Status: DC

## 2018-05-21 NOTE — Patient Instructions (Signed)
Shingrix and shingles prevention: know the facts!   Shingrix is a very effective vaccine to prevent shingles.   Shingles is a reactivation of chickenpox -more than 99% of Americans born before 1980 have had chickenpox even if they do not remember it. One in every 10 people who get shingles have severe long-lasting nerve pain as a result.   33 out of a 100 older adults will get shingles if they are unvaccinated.     This vaccine is very important for your health This vaccine is indicated for anyone 50 years or older. You can get this vaccine even if you have already had shingles because you can get the disease more than once in a lifetime.  Your risk for shingles and its complications increases with age.  This vaccine has 2 doses.  The second dose would be 2 to 6 months after the first dose.  If you had Zostavax vaccine in the past you should still get Shingrix. ( Zostavax is only 70% effective and it loses significant strength over a few years .)  This vaccine is given through the pharmacy.  The cost of the vaccine is through your insurance. The pharmacy can inform you of the total costs.  Common side effects including soreness in the arm, some redness and swelling, also some feel fatigue muscle soreness headache low-grade fever.  Side effects typically go away within 2 to 3 days. Remember-the pain from shingles can last a lifetime but these side effects of the vaccine will only last a few days at most. It is very important to get both doses in order to protect yourself fully.   Please get this vaccine at your earliest convenience at your trusted pharmacy.   DASH Eating Plan DASH stands for "Dietary Approaches to Stop Hypertension." The DASH eating plan is a healthy eating plan that has been shown to reduce high blood pressure (hypertension). It may also reduce your risk for type 2 diabetes, heart disease, and stroke. The DASH eating plan may also help with weight loss. What are tips  for following this plan?  General guidelines  Avoid eating more than 2,300 mg (milligrams) of salt (sodium) a day. If you have hypertension, you may need to reduce your sodium intake to 1,500 mg a day.  Limit alcohol intake to no more than 1 drink a day for nonpregnant women and 2 drinks a day for men. One drink equals 12 oz of beer, 5 oz of wine, or 1 oz of hard liquor.  Work with your health care provider to maintain a healthy body weight or to lose weight. Ask what an ideal weight is for you.  Get at least 30 minutes of exercise that causes your heart to beat faster (aerobic exercise) most days of the week. Activities may include walking, swimming, or biking.  Work with your health care provider or diet and nutrition specialist (dietitian) to adjust your eating plan to your individual calorie needs. Reading food labels   Check food labels for the amount of sodium per serving. Choose foods with less than 5 percent of the Daily Value of sodium. Generally, foods with less than 300 mg of sodium per serving fit into this eating plan.  To find whole grains, look for the word "whole" as the first word in the ingredient list. Shopping  Buy products labeled as "low-sodium" or "no salt added."  Buy fresh foods. Avoid canned foods and premade or frozen meals. Cooking  Avoid adding salt when cooking. Use   salt-free seasonings or herbs instead of table salt or sea salt. Check with your health care provider or pharmacist before using salt substitutes.  Do not fry foods. Cook foods using healthy methods such as baking, boiling, grilling, and broiling instead.  Cook with heart-healthy oils, such as olive, canola, soybean, or sunflower oil. Meal planning  Eat a balanced diet that includes: ? 5 or more servings of fruits and vegetables each day. At each meal, try to fill half of your plate with fruits and vegetables. ? Up to 6-8 servings of whole grains each day. ? Less than 6 oz of lean meat,  poultry, or fish each day. A 3-oz serving of meat is about the same size as a deck of cards. One egg equals 1 oz. ? 2 servings of low-fat dairy each day. ? A serving of nuts, seeds, or beans 5 times each week. ? Heart-healthy fats. Healthy fats called Omega-3 fatty acids are found in foods such as flaxseeds and coldwater fish, like sardines, salmon, and mackerel.  Limit how much you eat of the following: ? Canned or prepackaged foods. ? Food that is high in trans fat, such as fried foods. ? Food that is high in saturated fat, such as fatty meat. ? Sweets, desserts, sugary drinks, and other foods with added sugar. ? Full-fat dairy products.  Do not salt foods before eating.  Try to eat at least 2 vegetarian meals each week.  Eat more home-cooked food and less restaurant, buffet, and fast food.  When eating at a restaurant, ask that your food be prepared with less salt or no salt, if possible. What foods are recommended? The items listed may not be a complete list. Talk with your dietitian about what dietary choices are best for you. Grains Whole-grain or whole-wheat bread. Whole-grain or whole-wheat pasta. Brown rice. Oatmeal. Quinoa. Bulgur. Whole-grain and low-sodium cereals. Pita bread. Low-fat, low-sodium crackers. Whole-wheat flour tortillas. Vegetables Fresh or frozen vegetables (raw, steamed, roasted, or grilled). Low-sodium or reduced-sodium tomato and vegetable juice. Low-sodium or reduced-sodium tomato sauce and tomato paste. Low-sodium or reduced-sodium canned vegetables. Fruits All fresh, dried, or frozen fruit. Canned fruit in natural juice (without added sugar). Meat and other protein foods Skinless chicken or turkey. Ground chicken or turkey. Pork with fat trimmed off. Fish and seafood. Egg whites. Dried beans, peas, or lentils. Unsalted nuts, nut butters, and seeds. Unsalted canned beans. Lean cuts of beef with fat trimmed off. Low-sodium, lean deli meat. Dairy Low-fat  (1%) or fat-free (skim) milk. Fat-free, low-fat, or reduced-fat cheeses. Nonfat, low-sodium ricotta or cottage cheese. Low-fat or nonfat yogurt. Low-fat, low-sodium cheese. Fats and oils Soft margarine without trans fats. Vegetable oil. Low-fat, reduced-fat, or light mayonnaise and salad dressings (reduced-sodium). Canola, safflower, olive, soybean, and sunflower oils. Avocado. Seasoning and other foods Herbs. Spices. Seasoning mixes without salt. Unsalted popcorn and pretzels. Fat-free sweets. What foods are not recommended? The items listed may not be a complete list. Talk with your dietitian about what dietary choices are best for you. Grains Baked goods made with fat, such as croissants, muffins, or some breads. Dry pasta or rice meal packs. Vegetables Creamed or fried vegetables. Vegetables in a cheese sauce. Regular canned vegetables (not low-sodium or reduced-sodium). Regular canned tomato sauce and paste (not low-sodium or reduced-sodium). Regular tomato and vegetable juice (not low-sodium or reduced-sodium). Pickles. Olives. Fruits Canned fruit in a light or heavy syrup. Fried fruit. Fruit in cream or butter sauce. Meat and other protein foods Fatty cuts   of meat. Ribs. Fried meat. Bacon. Sausage. Bologna and other processed lunch meats. Salami. Fatback. Hotdogs. Bratwurst. Salted nuts and seeds. Canned beans with added salt. Canned or smoked fish. Whole eggs or egg yolks. Chicken or turkey with skin. Dairy Whole or 2% milk, cream, and half-and-half. Whole or full-fat cream cheese. Whole-fat or sweetened yogurt. Full-fat cheese. Nondairy creamers. Whipped toppings. Processed cheese and cheese spreads. Fats and oils Butter. Stick margarine. Lard. Shortening. Ghee. Bacon fat. Tropical oils, such as coconut, palm kernel, or palm oil. Seasoning and other foods Salted popcorn and pretzels. Onion salt, garlic salt, seasoned salt, table salt, and sea salt. Worcestershire sauce. Tartar sauce.  Barbecue sauce. Teriyaki sauce. Soy sauce, including reduced-sodium. Steak sauce. Canned and packaged gravies. Fish sauce. Oyster sauce. Cocktail sauce. Horseradish that you find on the shelf. Ketchup. Mustard. Meat flavorings and tenderizers. Bouillon cubes. Hot sauce and Tabasco sauce. Premade or packaged marinades. Premade or packaged taco seasonings. Relishes. Regular salad dressings. Where to find more information:  National Heart, Lung, and Blood Institute: www.nhlbi.nih.gov  American Heart Association: www.heart.org Summary  The DASH eating plan is a healthy eating plan that has been shown to reduce high blood pressure (hypertension). It may also reduce your risk for type 2 diabetes, heart disease, and stroke.  With the DASH eating plan, you should limit salt (sodium) intake to 2,300 mg a day. If you have hypertension, you may need to reduce your sodium intake to 1,500 mg a day.  When on the DASH eating plan, aim to eat more fresh fruits and vegetables, whole grains, lean proteins, low-fat dairy, and heart-healthy fats.  Work with your health care provider or diet and nutrition specialist (dietitian) to adjust your eating plan to your individual calorie needs. This information is not intended to replace advice given to you by your health care provider. Make sure you discuss any questions you have with your health care provider. Document Released: 04/04/2011 Document Revised: 04/08/2016 Document Reviewed: 04/08/2016 Elsevier Interactive Patient Education  2019 [employer]      

## 2018-05-21 NOTE — Progress Notes (Signed)
Subjective:    Patient ID: Raymond Rosales, male    DOB: 01/10/1961, 58 y.o.   MRN: [id number]  HPI Patient is here today for his annual exam.  The patient comes in today for a wellness visit.  Has a history of hypertension and he is not on any medication. He has brought some readings with him today. 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  A review of their health history was completed.  A review of medications was also completed. Patient does relate left shoulder pain discomfort more so when he puts his arm behind his back or when he moves in certain positions Any needed refills; No  Eating habits: tries to eat healty   Falls/  MVA accidents in past few months: No  Regular exercise: Walk Q am  Specialist pt sees on regular basis: None  Preventative health issues were discussed.   Additional concerns: He states bilateral ear ringing.   Left shoulder pain that radiates sown the arm when he positions his arm behind his back.     Review of Systems  Constitutional: Negative for diaphoresis and fatigue.  HENT: Negative for congestion and rhinorrhea.   Respiratory: Negative for cough and shortness of breath.   Cardiovascular: Negative for chest pain and leg swelling.  Gastrointestinal: Negative for abdominal pain and diarrhea.  Skin: Negative for color change and rash.  Neurological: Negative for dizziness and headaches.  Psychiatric/Behavioral: Negative for behavioral problems and confusion.       Objective:   Physical Exam Vitals signs reviewed.  Constitutional:      General: He is not in acute distress. HENT:    Head: Normocephalic and atraumatic.  Eyes:     General:        Right eye: No discharge.        Left eye: No discharge.  Neck:     Trachea: No tracheal deviation.  Cardiovascular:     Rate and Rhythm: Normal rate and regular rhythm.     Heart sounds: Normal heart sounds. No murmur.  Pulmonary:     Effort: Pulmonary effort is normal. No respiratory distress.     Breath sounds: Normal breath sounds.  Genitourinary:    Prostate: Normal.  Lymphadenopathy:     Cervical: No cervical adenopathy.  Skin:    General: Skin is warm and dry.  Neurological:     Mental Status: He is alert.     Coordination: Coordination normal.  Psychiatric:        Behavior: Behavior normal.     Prostate exam normal      Assessment & Plan:  Adult wellness-complete.wellness physical was conducted today. Importance of diet and exercise were discussed in detail.  In addition to this a discussion regarding safety was also covered. We also reviewed over immunizations and gave recommendations regarding current immunization needed for age.  In addition to this additional areas were also touched on including: Preventative health exams needed:  Colonoscopy next colonoscopy 2024 patient did have tubular adenoma  Patient was advised yearly wellness exam  Patient does have hypertension blood pressure readings under fairly  good control but recent readings have several of the lower numbers in the 80s he recently had eye exam which showed some hypertensive changes along with a small punctate bleed therefore we will go ahead and start amlodipine 2.5 mg every single day side effects were discussed patient to follow-up if ongoing trouble otherwise recheck in 8 weeks  Hyperlipidemia patient will try dietary measures get this under better control based on the risk stratification he is at high risk for heart disease therefore I recommended lipid medication the patient at this point time would like to try dietary will read  look at the labs again in approximately 5 to 6 months  Left shoulder tendinitis exercises were shown along with a handout to help him with rotator cuff tendinitis

## 2018-05-22 ENCOUNTER — Encounter: Payer: Self-pay | Admitting: Family Medicine

## 2018-10-12 ENCOUNTER — Telehealth: Payer: Self-pay | Admitting: Family Medicine

## 2018-10-12 DIAGNOSIS — E785 Hyperlipidemia, unspecified: Secondary | ICD-10-CM

## 2018-10-12 DIAGNOSIS — I10 Essential (primary) hypertension: Secondary | ICD-10-CM

## 2018-10-12 NOTE — Telephone Encounter (Signed)
Pt wanted to let dr Nicki Reaper he is also having knee pain he wants to talk about next week at visit. And pt states he will drop off bp reading for dr scott to review.

## 2018-10-12 NOTE — Telephone Encounter (Signed)
I would recommend lipid, met 7-hyperlipidemia, hypertension

## 2018-10-12 NOTE — Telephone Encounter (Signed)
bw orders put in and pt notified.

## 2018-10-12 NOTE — Telephone Encounter (Signed)
Last labs 05/14/18 lipid, liver, bmp, cbc, psa, hiv, hep c

## 2018-10-12 NOTE — Telephone Encounter (Signed)
Patient wanting to know if he needing labs done for his six month follow up with you . He schedule for 6/23 virtual visit. Please advise

## 2018-10-12 NOTE — Telephone Encounter (Signed)
ok 

## 2018-10-15 LAB — LIPID PANEL
Chol/HDL Ratio: 3.9 ratio (ref 0.0–5.0)
Cholesterol, Total: 177 mg/dL (ref 100–199)
HDL: 45 mg/dL (ref >39)
LDL Calculated: 110 mg/dL — ABNORMAL HIGH (ref 0–99)
Triglycerides: 110 mg/dL (ref 0–149)
VLDL Cholesterol Cal: 22 mg/dL (ref 5–40)

## 2018-10-15 LAB — BASIC METABOLIC PANEL
BUN/Creatinine Ratio: 18 (ref 9–20)
BUN: 16 mg/dL (ref 6–24)
CO2: 25 mmol/L (ref 20–29)
Calcium: 9.1 mg/dL (ref 8.7–10.2)
Chloride: 105 mmol/L (ref 96–106)
Creatinine, Ser: 0.88 mg/dL (ref 0.76–1.27)
GFR calc Af Amer: 109 mL/min/{1.73_m2} (ref >59)
GFR calc non Af Amer: 94 mL/min/{1.73_m2} (ref >59)
Glucose: 98 mg/dL (ref 65–99)
Potassium: 4.4 mmol/L (ref 3.5–5.2)
Sodium: 142 mmol/L (ref 134–144)

## 2018-10-20 ENCOUNTER — Ambulatory Visit (INDEPENDENT_AMBULATORY_CARE_PROVIDER_SITE_OTHER): Payer: BC Managed Care – PPO | Admitting: Family Medicine

## 2018-10-20 ENCOUNTER — Other Ambulatory Visit: Payer: Self-pay

## 2018-10-20 ENCOUNTER — Encounter: Payer: Self-pay | Admitting: Family Medicine

## 2018-10-20 VITALS — Ht 68.75 in | Wt 193.0 lb

## 2018-10-20 DIAGNOSIS — I10 Essential (primary) hypertension: Secondary | ICD-10-CM | POA: Diagnosis not present

## 2018-10-20 MED ORDER — AMLODIPINE BESYLATE 5 MG PO TABS: 5.0000 mg | ORAL_TABLET | Freq: Every day | ORAL | 6 refills | Status: DC

## 2018-10-20 NOTE — Progress Notes (Signed)
Patient had eye exam on 05/14/18 at [facility] and a copy is in  [place] under media from 05/14/18 [facility].

## 2018-10-20 NOTE — Progress Notes (Signed)
   Subjective:    Patient ID: Raymond Rosales, male    DOB: 08-12-1960, 58 y.o.   MRN: [id number] Video visit Hypertension This is a chronic problem. Pertinent negatives include no chest pain, headaches or shortness of breath. Treatments tried: amlodipine 2.5mg . There are no compliance problems (takes med every day, has lost 10 lbs since january, walks every morning).   pt states bp has been running high. He dropped off readings last week. And concerns about blood vessels in eye. Patient has been doing a good job watching his diet staying physically active losing some weight is concerned because sometimes blood pressure running higher than it should also he has had a follow-up visit with ophthalmology that showed that he was having hypertensive changes  Left knee burning for several months. Burning when turning knee a certain way.   Virtual Visit via Video Note  I connected with [name] on 10/20/18 at  8:30 AM EDT by a video enabled telemedicine application and verified that I am speaking with the correct person using two identifiers.  Location: Patient: home Provider: office   I discussed the limitations of evaluation and management by telemedicine and the availability of in person appointments. The patient expressed understanding and agreed to proceed.  History of Present Illness:    Observations/Objective:   Assessment and Plan:   Follow Up Instructions:    I discussed the assessment and treatment plan with the patient. The patient was provided an opportunity to ask questions and all were answered. The patient agreed with the plan and demonstrated an understanding of the instructions.   The patient was advised to call back or seek an in-person evaluation if the symptoms worsen or if the condition fails to improve as anticipated.  I provided 15 minutes of non-face-to-face time during this encounter.      Review of Systems  Constitutional: Negative for activity  change, fatigue and fever.  HENT: Negative for congestion and rhinorrhea.   Respiratory: Negative for cough and shortness of breath.   Cardiovascular: Negative for chest pain and leg swelling.  Gastrointestinal: Negative for abdominal pain, diarrhea and nausea.  Genitourinary: Negative for dysuria and hematuria.  Neurological: Negative for weakness and headaches.  Psychiatric/Behavioral: Negative for agitation and behavioral problems.       Objective:   Physical Exam   Patient had virtual visit Appears to be in no distress Atraumatic Neuro able to relate and oriented No apparent resp distress Color normal      Assessment & Plan:  The blood pressure is not under as good control as well would like to see. Cholesterol profile looks significantly improved Patient is doing a good job with dietary measures and exercise Based upon his readings based upon discussion I recommend increasing amlodipine new dose 5 mg daily  Patient also has some knee discomfort where he gets a burning pain discomfort does not lock or give way more than likely of mild inflammation or nerve impingement we will watch this further examination when the patient follows up in person   Patient will send [place] blood pressure readouts within the next few weeks  Also there is some concern about the possibility of hypertensive changes within the eye.  Dr. Jorja Loa apparently recommended to the patient the possibility of doing a carotid study and recommend the patient talk to me about that.  We will try to get the most recent notes from Dr. Jorja Loa regarding that patient may well need to have carotid study.

## 2018-12-21 ENCOUNTER — Ambulatory Visit: Payer: BC Managed Care – PPO | Admitting: Family Medicine

## 2018-12-21 ENCOUNTER — Other Ambulatory Visit: Payer: Self-pay

## 2018-12-21 ENCOUNTER — Encounter: Payer: Self-pay | Admitting: Family Medicine

## 2018-12-21 VITALS — BP 122/78 | Temp 97.5°F | Ht 68.75 in | Wt 192.0 lb

## 2018-12-21 DIAGNOSIS — I10 Essential (primary) hypertension: Secondary | ICD-10-CM

## 2018-12-21 DIAGNOSIS — Z79899 Other long term (current) drug therapy: Secondary | ICD-10-CM

## 2018-12-21 DIAGNOSIS — Z125 Encounter for screening for malignant neoplasm of prostate: Secondary | ICD-10-CM | POA: Diagnosis not present

## 2018-12-21 NOTE — Progress Notes (Addendum)
   Subjective:    Patient ID: Raymond Rosales, male    DOB: 20-May-1960, 58 y.o.   MRN: [id number]  Hypertension This is a chronic problem. Pertinent negatives include no chest pain, headaches or shortness of breath. Treatments tried: norvasc 5mg .  pt brought in his bp readings.  Pt states no concerns today. Eats healthy and walks. Has lost some weight.  Patient been exercising watching diet trying to lose weight.  Patient doing a good job taking his medicines.  Denies any setbacks.  Energy level overall doing fairly good.  No chest tightness pressure pain shortness of breath or swelling in the legs occasionally gets puffy in the ankles   Review of Systems  Constitutional: Negative for diaphoresis and fatigue.  HENT: Negative for congestion and rhinorrhea.   Respiratory: Negative for cough and shortness of breath.   Cardiovascular: Negative for chest pain and leg swelling.  Gastrointestinal: Negative for abdominal pain and diarrhea.  Skin: Negative for color change and rash.  Neurological: Negative for dizziness and headaches.  Psychiatric/Behavioral: Negative for behavioral problems and confusion.  ROS verified     Objective:   Physical Exam Vitals signs reviewed.  Constitutional:      General: He is not in acute distress. HENT:     Head: Normocephalic and atraumatic.  Eyes:     General:        Right eye: No discharge.        Left eye: No discharge.  Neck:     Trachea: No tracheal deviation.  Cardiovascular:     Rate and Rhythm: Normal rate and regular rhythm.     Heart sounds: Normal heart sounds. No murmur.  Pulmonary:     Effort: Pulmonary effort is normal. No respiratory distress.     Breath sounds: Normal breath sounds. No wheezing.  Lymphadenopathy:     Cervical: No cervical adenopathy.  Skin:    General: Skin is warm.     Findings: No rash.  Neurological:     Mental Status: He is alert.  Psychiatric:        Behavior: Behavior normal.     Exam benign Blood  pressure good review of blood pressure medicine and his readings and discussion regarding exercise and blood pressure 15 minutes was spent with patient today discussing healthcare issues which they came.  More than 50% of this visit-total duration of visit-was spent in counseling and coordination of care.  Please see diagnosis regarding the focus of this coordination and care      Assessment & Plan:  HTN- Patient was seen today as part of a visit regarding hypertension. The importance of healthy diet and regular physical activity was discussed. The importance of compliance with medications discussed.  Ideal goal is to keep blood pressure low elevated levels certainly below [id number] when possible.  The patient was counseled that keeping blood pressure under control lessen his risk of complications.  The importance of regular follow-ups was discussed with the patient.  Low-salt diet such as DASH recommended.  Regular physical activity was recommended as well.  Patient was advised to keep regular follow-ups.  Have had the patient sign a release to get records from optometry sent here  Very good on current medication continue current medication watch diet closely follow-up in 6 months time for wellness sooner problems

## 2018-12-24 ENCOUNTER — Encounter: Payer: Self-pay | Admitting: Family Medicine

## 2019-05-18 LAB — BASIC METABOLIC PANEL
BUN/Creatinine Ratio: 16 (ref 9–20)
BUN: 15 mg/dL (ref 6–24)
CO2: 27 mmol/L (ref 20–29)
Calcium: 9.4 mg/dL (ref 8.7–10.2)
Chloride: 105 mmol/L (ref 96–106)
Creatinine, Ser: 0.91 mg/dL (ref 0.76–1.27)
GFR calc Af Amer: 106 mL/min/{1.73_m2} (ref >59)
GFR calc non Af Amer: 92 mL/min/{1.73_m2} (ref >59)
Glucose: 98 mg/dL (ref 65–99)
Potassium: 4.8 mmol/L (ref 3.5–5.2)
Sodium: 141 mmol/L (ref 134–144)

## 2019-05-18 LAB — HEPATIC FUNCTION PANEL
ALT: 16 IU/L (ref 0–44)
AST: 17 IU/L (ref 0–40)
Albumin: 4.2 g/dL (ref 3.8–4.9)
Alkaline Phosphatase: 67 IU/L (ref 39–117)
Bilirubin Total: 0.4 mg/dL (ref 0.0–1.2)
Bilirubin, Direct: 0.1 mg/dL (ref 0.00–0.40)
Total Protein: 6.4 g/dL (ref 6.0–8.5)

## 2019-05-18 LAB — LIPID PANEL
Chol/HDL Ratio: 4 ratio (ref 0.0–5.0)
Cholesterol, Total: 187 mg/dL (ref 100–199)
HDL: 47 mg/dL (ref >39)
LDL Chol Calc (NIH): 116 mg/dL — ABNORMAL HIGH (ref 0–99)
Triglycerides: 136 mg/dL (ref 0–149)
VLDL Cholesterol Cal: 24 mg/dL (ref 5–40)

## 2019-05-18 LAB — PSA: Prostate Specific Ag, Serum: 0.8 ng/mL (ref 0.0–4.0)

## 2019-05-24 ENCOUNTER — Encounter: Payer: Self-pay | Admitting: Family Medicine

## 2019-05-24 ENCOUNTER — Other Ambulatory Visit: Payer: Self-pay

## 2019-05-24 ENCOUNTER — Ambulatory Visit (INDEPENDENT_AMBULATORY_CARE_PROVIDER_SITE_OTHER): Payer: BC Managed Care – PPO | Admitting: Family Medicine

## 2019-05-24 VITALS — BP 120/76 | Temp 98.2°F | Ht 68.75 in | Wt 193.0 lb

## 2019-05-24 DIAGNOSIS — Z0000 Encounter for general adult medical examination without abnormal findings: Secondary | ICD-10-CM

## 2019-05-24 MED ORDER — AMLODIPINE BESYLATE 5 MG PO TABS: 5.0000 mg | ORAL_TABLET | Freq: Every day | ORAL | 1 refills | Status: DC

## 2019-05-24 NOTE — Progress Notes (Signed)
Subjective:    Patient ID: Raymond Rosales, male    DOB: 02-03-1961, 59 y.o.   MRN: [id number]  HPI  The patient comes in today for a wellness visit. Patient is doing walking on a regular basis He does try to watch his diet Patient denies being depressed Does have some level of erectile dysfunction but not severe Did do his lab works we discussed these today    A review of their health history was completed.  A review of medications was also completed.  Any needed refills; yes and would like 90 days  Eating habits: doing good  Falls/  MVA accidents in past few months: none  Regular exercise: walk everyday  Specialist pt sees on regular basis: none  Preventative health issues were discussed.   Additional concerns: none Results for orders placed or performed in visit on 12/21/18  Lipid panel  Result Value Ref Range   Cholesterol, Total 187 100 - 199 mg/dL   Triglycerides 136 0 - 149 mg/dL   HDL 47 >39 mg/dL   VLDL Cholesterol Cal 24 5 - 40 mg/dL   LDL Chol Calc (NIH) 116 (H) 0 - 99 mg/dL   Chol/HDL Ratio 4.0 0.0 - 5.0 ratio  Hepatic function panel  Result Value Ref Range   Total Protein 6.4 6.0 - 8.5 g/dL   Albumin 4.2 3.8 - 4.9 g/dL   Bilirubin Total 0.4 0.0 - 1.2 mg/dL   Bilirubin, Direct 0.10 0.00 - 0.40 mg/dL   Alkaline Phosphatase 67 39 - 117 IU/L   AST 17 0 - 40 IU/L   ALT 16 0 - 44 IU/L  Basic metabolic panel  Result Value Ref Range   Glucose 98 65 - 99 mg/dL   BUN 15 6 - 24 mg/dL   Creatinine, Ser 0.91 0.76 - 1.27 mg/dL   GFR calc non Af Amer 92 >59 mL/min/1.73   GFR calc Af Amer 106 >59 mL/min/1.73   BUN/Creatinine Ratio 16 9 - 20   Sodium 141 134 - 144 mmol/L   Potassium 4.8 3.5 - 5.2 mmol/L   Chloride 105 96 - 106 mmol/L   CO2 27 20 - 29 mmol/L   Calcium 9.4 8.7 - 10.2 mg/dL  PSA  Result Value Ref Range   Prostate Specific Ag, Serum 0.8 0.0 - 4.0 ng/mL     Review of Systems  Constitutional: Negative for activity change, appetite change  and fever.  HENT: Negative for congestion and rhinorrhea.   Eyes: Negative for discharge.  Respiratory: Negative for cough and wheezing.   Cardiovascular: Negative for chest pain.  Gastrointestinal: Negative for abdominal pain, blood in stool and vomiting.  Genitourinary: Negative for difficulty urinating and frequency.  Musculoskeletal: Negative for neck pain.  Skin: Negative for rash.  Allergic/Immunologic: Negative for environmental allergies and food allergies.  Neurological: Negative for weakness and headaches.  Psychiatric/Behavioral: Negative for agitation.       Objective:   Physical Exam Constitutional:      Appearance: He is well-developed.  HENT:     Head: Normocephalic and atraumatic.     Right Ear: External ear normal.     Left Ear: External ear normal.     Nose: Nose normal.  Eyes:     Pupils: Pupils are equal, round, and reactive to light.  Neck:     Thyroid: No thyromegaly.  Cardiovascular:     Rate and Rhythm: Normal rate and regular rhythm.     Heart sounds: Normal heart sounds. No  murmur.  Pulmonary:     Effort: Pulmonary effort is normal. No respiratory distress.     Breath sounds: Normal breath sounds. No wheezing.  Abdominal:     General: Bowel sounds are normal. There is no distension.     Palpations: Abdomen is soft. There is no mass.     Tenderness: There is no abdominal tenderness.  Genitourinary:    Penis: Normal.   Musculoskeletal:        General: Normal range of motion.     Cervical back: Normal range of motion and neck supple.  Lymphadenopathy:     Cervical: No cervical adenopathy.  Skin:    General: Skin is warm and dry.     Findings: No erythema.  Neurological:     Mental Status: He is alert.     Motor: No abnormal muscle tone.  Psychiatric:        Behavior: Behavior normal.        Judgment: Judgment normal.           Assessment & Plan:  Adult wellness-complete.wellness physical was conducted today. Importance of diet and  exercise were discussed in detail.  In addition to this a discussion regarding safety was also covered. We also reviewed over immunizations and gave recommendations regarding current immunization needed for age.  In addition to this additional areas were also touched on including: Preventative health exams needed:  Colonoscopy 2024  Patient was advised yearly wellness exam Patient's blood pressure doing well follow-up in summertime Hyperlipidemia very important for the patient to get this under control we discussed the possibility of doing cholesterol medicine he defers on this currently would like to work hard on diet we will check it again by summer if it is not under good control at that point he needs to be on a statin  Mild erectile dysfunction patient will monitor and let us know if any problems does not feel like he needs to be on medicine just yet

## 2019-11-16 ENCOUNTER — Telehealth: Payer: Self-pay | Admitting: Family Medicine

## 2019-11-16 NOTE — Telephone Encounter (Signed)
Pt called has left message has Apt for 08/2 and out of blood pressure med by that time needs refill amLODipine (NORVASC) 5 MG tablet

## 2019-11-16 NOTE — Telephone Encounter (Signed)
Last labs 05/17/19 lipid, liver, bmp, psa

## 2019-11-16 NOTE — Telephone Encounter (Signed)
Lipid, metabolic 7

## 2019-11-16 NOTE — Telephone Encounter (Signed)
Does Pt  Need  lab work order before his visit 08/02

## 2019-11-17 ENCOUNTER — Other Ambulatory Visit: Payer: Self-pay | Admitting: *Deleted

## 2019-11-17 DIAGNOSIS — E785 Hyperlipidemia, unspecified: Secondary | ICD-10-CM

## 2019-11-17 DIAGNOSIS — I10 Essential (primary) hypertension: Secondary | ICD-10-CM

## 2019-11-17 MED ORDER — AMLODIPINE BESYLATE 5 MG PO TABS: 5.0000 mg | ORAL_TABLET | Freq: Every day | ORAL | 0 refills | Status: DC

## 2019-11-17 NOTE — Telephone Encounter (Signed)
Labs ordered in epic. Patient notified ?

## 2019-11-25 LAB — BASIC METABOLIC PANEL
BUN/Creatinine Ratio: 16 (ref 10–24)
BUN: 15 mg/dL (ref 8–27)
CO2: 25 mmol/L (ref 20–29)
Calcium: 9.3 mg/dL (ref 8.6–10.2)
Chloride: 104 mmol/L (ref 96–106)
Creatinine, Ser: 0.93 mg/dL (ref 0.76–1.27)
GFR calc Af Amer: 103 mL/min/{1.73_m2} (ref >59)
GFR calc non Af Amer: 89 mL/min/{1.73_m2} (ref >59)
Glucose: 100 mg/dL — ABNORMAL HIGH (ref 65–99)
Potassium: 5.1 mmol/L (ref 3.5–5.2)
Sodium: 141 mmol/L (ref 134–144)

## 2019-11-25 LAB — LIPID PANEL
Chol/HDL Ratio: 3.9 ratio (ref 0.0–5.0)
Cholesterol, Total: 202 mg/dL — ABNORMAL HIGH (ref 100–199)
HDL: 52 mg/dL (ref >39)
LDL Chol Calc (NIH): 125 mg/dL — ABNORMAL HIGH (ref 0–99)
Triglycerides: 143 mg/dL (ref 0–149)
VLDL Cholesterol Cal: 25 mg/dL (ref 5–40)

## 2019-11-29 ENCOUNTER — Other Ambulatory Visit: Payer: Self-pay

## 2019-11-29 ENCOUNTER — Ambulatory Visit: Payer: BC Managed Care – PPO | Admitting: Family Medicine

## 2019-11-29 ENCOUNTER — Encounter: Payer: Self-pay | Admitting: Family Medicine

## 2019-11-29 VITALS — BP 130/86 | Temp 97.3°F | Wt 195.0 lb

## 2019-11-29 DIAGNOSIS — I10 Essential (primary) hypertension: Secondary | ICD-10-CM | POA: Diagnosis not present

## 2019-11-29 DIAGNOSIS — E785 Hyperlipidemia, unspecified: Secondary | ICD-10-CM | POA: Diagnosis not present

## 2019-11-29 HISTORY — DX: Hyperlipidemia, unspecified: E78.5

## 2019-11-29 NOTE — Progress Notes (Signed)
   Subjective:    Patient ID: Raymond Rosales, male    DOB: January 18, 1961, 59 y.o.   MRN: [id number]  Hypertension This is a chronic problem. Treatments tried: Amlodipine. There are no compliance problems.   Patient here for follow-up blood pressure Does well with taking medicine Denies any chest tightness pressure pain Patient for blood pressure check up.  The patient does have hypertension.  The patient is on medication.  Patient relates compliance with meds. Todays BP reviewed with the patient. Patient denies issues with medication. Patient relates reasonable diet. Patient tries to minimize salt. Patient aware of BP goals.  Patient also with underlying hyperlipidemia not on medications currently Recent lab work reviewed with patient Patient here for follow-up regarding cholesterol.  The patient does have hyperlipidemia.  Patient does try to maintain a reasonable diet.   Prior blood work results reviewed with the patient.  The patient is aware of his cholesterol goals and the need to keep it under good control to lessen the risk of disease.     Review of Systems Currently no shortness of breath chest tightness    Objective:   Physical Exam Lungs clear respiratory rate normal heart regular no murmurs skin warm dry patient no acute distress       Assessment & Plan:  Very nice patient Blood pressure under good control Stays physically active His readings look good Healthy diet recommended continue medication  Hyperlipidemia borderline LDL will do more specific cardio risk lipid panel via [facility] patient will do this in approximately 4 to 6 weeks  Cardio IQ test

## 2019-11-30 ENCOUNTER — Other Ambulatory Visit: Payer: Self-pay | Admitting: *Deleted

## 2020-02-04 ENCOUNTER — Telehealth: Payer: Self-pay | Admitting: Nurse Practitioner

## 2020-02-04 NOTE — Telephone Encounter (Signed)
In office today with his daughter. Requested BP check.

## 2020-02-06 ENCOUNTER — Telehealth: Payer: Self-pay | Admitting: Family Medicine

## 2020-02-06 NOTE — Telephone Encounter (Signed)
-----   Message from Nilda Simmer, NP sent at 02/04/2020  4:40 PM EDT ----- Raymond Rosales came today for his daughter's visit. Requested BP 176/100 right arm sitting and 168/100 left arm sitting. He attributes this to stress since his BP log shows very good BP readings back in July. Wanted you to be aware. I have encouraged him to make an appointment with you to discuss further.  [name]

## 2020-02-06 NOTE — Telephone Encounter (Signed)
Front I did communicate with this patient He is coming to be seen at 11:30 AM on Tuesday for elevated blood pressure thank you

## 2020-02-07 NOTE — Telephone Encounter (Signed)
Please put patient on the schedule for 11:30 Tuesday morning.

## 2020-02-08 ENCOUNTER — Ambulatory Visit (INDEPENDENT_AMBULATORY_CARE_PROVIDER_SITE_OTHER): Payer: BC Managed Care – PPO | Admitting: Family Medicine

## 2020-02-08 ENCOUNTER — Encounter: Payer: Self-pay | Admitting: Family Medicine

## 2020-02-08 VITALS — BP 122/78 | HR 74 | Temp 97.8°F | Ht 68.75 in | Wt 202.2 lb

## 2020-02-08 DIAGNOSIS — I10 Essential (primary) hypertension: Secondary | ICD-10-CM

## 2020-02-08 MED ORDER — AMLODIPINE BESYLATE 10 MG PO TABS: 10.0000 mg | ORAL_TABLET | Freq: Every day | ORAL | 1 refills | Status: DC

## 2020-02-08 MED ORDER — MOMETASONE FUROATE 0.1 % EX CREA: TOPICAL_CREAM | CUTANEOUS | 2 refills | Status: AC

## 2020-02-08 NOTE — Progress Notes (Signed)
   Subjective:    Patient ID: Raymond Rosales, male    DOB: Jan 13, 1961, 59 y.o.   MRN: [id number]  HPI  Patient arrives for a follow up on blood pressure. Patient's blood pressure been up lately He finds himself feeling stressed at times Denies feeling anxious.  Under a lot of pressure at work When he thinks about his blood pressure does go up Currently taking amlodipine 5 mg taking 2 daily.  No swelling in the legs Review of Systems  Constitutional: Negative for diaphoresis and fatigue.  HENT: Negative for congestion and rhinorrhea.   Respiratory: Negative for cough and shortness of breath.   Cardiovascular: Negative for chest pain and leg swelling.  Gastrointestinal: Negative for abdominal pain and diarrhea.  Skin: Negative for color change and rash.  Neurological: Negative for dizziness and headaches.  Psychiatric/Behavioral: Negative for behavioral problems and confusion.       Objective:   Physical Exam Vitals reviewed.  Constitutional:      General: He is not in acute distress. HENT:     Head: Normocephalic and atraumatic.  Eyes:     General:        Right eye: No discharge.        Left eye: No discharge.  Neck:     Trachea: No tracheal deviation.  Cardiovascular:     Rate and Rhythm: Normal rate and regular rhythm.     Heart sounds: Normal heart sounds. No murmur heard.   Pulmonary:     Effort: Pulmonary effort is normal. No respiratory distress.     Breath sounds: Normal breath sounds.  Lymphadenopathy:     Cervical: No cervical adenopathy.  Skin:    General: Skin is warm and dry.  Neurological:     Mental Status: He is alert.     Coordination: Coordination normal.  Psychiatric:        Behavior: Behavior normal.       Very nice patient    Assessment & Plan:  HTN- patient seen for follow-up regarding HTN.  Diet, medication compliance, appropriate labs and refills were completed.  Importance of keeping blood pressure under good control to lessen the  risk of complications discussed  Patient seems to be doing well on 10 mg amlodipine daily.  Continue this currently.  Recheck again in 4 to 6 weeks. I have encouraged patient not to check his his blood pressure readings too often because I believe that that is contributing to some degree with him feeling stressed

## 2020-03-21 ENCOUNTER — Encounter: Payer: Self-pay | Admitting: Family Medicine

## 2020-03-21 ENCOUNTER — Ambulatory Visit (INDEPENDENT_AMBULATORY_CARE_PROVIDER_SITE_OTHER): Payer: BC Managed Care – PPO | Admitting: Family Medicine

## 2020-03-21 ENCOUNTER — Other Ambulatory Visit: Payer: Self-pay

## 2020-03-21 VITALS — BP 122/78 | HR 106 | Temp 96.9°F | Wt 195.0 lb

## 2020-03-21 DIAGNOSIS — I10 Essential (primary) hypertension: Secondary | ICD-10-CM | POA: Diagnosis not present

## 2020-03-21 DIAGNOSIS — R5383 Other fatigue: Secondary | ICD-10-CM | POA: Diagnosis not present

## 2020-03-21 DIAGNOSIS — Z125 Encounter for screening for malignant neoplasm of prostate: Secondary | ICD-10-CM

## 2020-03-21 DIAGNOSIS — E663 Overweight: Secondary | ICD-10-CM

## 2020-03-21 NOTE — Patient Instructions (Signed)
DASH Eating Plan DASH stands for "Dietary Approaches to Stop Hypertension." The DASH eating plan is a healthy eating plan that has been shown to reduce high blood pressure (hypertension). It may also reduce your risk for type 2 diabetes, heart disease, and stroke. The DASH eating plan may also help with weight loss. What are tips for following this plan?  General guidelines  Avoid eating more than 2,300 mg (milligrams) of salt (sodium) a day. If you have hypertension, you may need to reduce your sodium intake to 1,500 mg a day.  Limit alcohol intake to no more than 1 drink a day for nonpregnant women and 2 drinks a day for men. One drink equals 12 oz of beer, 5 oz of wine, or 1 oz of hard liquor.  Work with your health care provider to maintain a healthy body weight or to lose weight. Ask what an ideal weight is for you.  Get at least 30 minutes of exercise that causes your heart to beat faster (aerobic exercise) most days of the week. Activities may include walking, swimming, or biking.  Work with your health care provider or diet and nutrition specialist (dietitian) to adjust your eating plan to your individual calorie needs. Reading food labels   Check food labels for the amount of sodium per serving. Choose foods with less than 5 percent of the Daily Value of sodium. Generally, foods with less than 300 mg of sodium per serving fit into this eating plan.  To find whole grains, look for the word "whole" as the first word in the ingredient list. Shopping  Buy products labeled as "low-sodium" or "no salt added."  Buy fresh foods. Avoid canned foods and premade or frozen meals. Cooking  Avoid adding salt when cooking. Use salt-free seasonings or herbs instead of table salt or sea salt. Check with your health care provider or pharmacist before using salt substitutes.  Do not fry foods. Cook foods using healthy methods such as baking, boiling, grilling, and broiling instead.  Cook with  heart-healthy oils, such as olive, canola, soybean, or sunflower oil. Meal planning  Eat a balanced diet that includes: ? 5 or more servings of fruits and vegetables each day. At each meal, try to fill half of your plate with fruits and vegetables. ? Up to 6-8 servings of whole grains each day. ? Less than 6 oz of lean meat, poultry, or fish each day. A 3-oz serving of meat is about the same size as a deck of cards. One egg equals 1 oz. ? 2 servings of low-fat dairy each day. ? A serving of nuts, seeds, or beans 5 times each week. ? Heart-healthy fats. Healthy fats called Omega-3 fatty acids are found in foods such as flaxseeds and coldwater fish, like sardines, salmon, and mackerel.  Limit how much you eat of the following: ? Canned or prepackaged foods. ? Food that is high in trans fat, such as fried foods. ? Food that is high in saturated fat, such as fatty meat. ? Sweets, desserts, sugary drinks, and other foods with added sugar. ? Full-fat dairy products.  Do not salt foods before eating.  Try to eat at least 2 vegetarian meals each week.  Eat more home-cooked food and less restaurant, buffet, and fast food.  When eating at a restaurant, ask that your food be prepared with less salt or no salt, if possible. What foods are recommended? The items listed may not be a complete list. Talk with your dietitian about   what dietary choices are best for you. Grains Whole-grain or whole-wheat bread. Whole-grain or whole-wheat pasta. Brown rice. Oatmeal. Quinoa. Bulgur. Whole-grain and low-sodium cereals. Pita bread. Low-fat, low-sodium crackers. Whole-wheat flour tortillas. Vegetables Fresh or frozen vegetables (raw, steamed, roasted, or grilled). Low-sodium or reduced-sodium tomato and vegetable juice. Low-sodium or reduced-sodium tomato sauce and tomato paste. Low-sodium or reduced-sodium canned vegetables. Fruits All fresh, dried, or frozen fruit. Canned fruit in natural juice (without  added sugar). Meat and other protein foods Skinless chicken or turkey. Ground chicken or turkey. Pork with fat trimmed off. Fish and seafood. Egg whites. Dried beans, peas, or lentils. Unsalted nuts, nut butters, and seeds. Unsalted canned beans. Lean cuts of beef with fat trimmed off. Low-sodium, lean deli meat. Dairy Low-fat (1%) or fat-free (skim) milk. Fat-free, low-fat, or reduced-fat cheeses. Nonfat, low-sodium ricotta or cottage cheese. Low-fat or nonfat yogurt. Low-fat, low-sodium cheese. Fats and oils Soft margarine without trans fats. Vegetable oil. Low-fat, reduced-fat, or light mayonnaise and salad dressings (reduced-sodium). Canola, safflower, olive, soybean, and sunflower oils. Avocado. Seasoning and other foods Herbs. Spices. Seasoning mixes without salt. Unsalted popcorn and pretzels. Fat-free sweets. What foods are not recommended? The items listed may not be a complete list. Talk with your dietitian about what dietary choices are best for you. Grains Baked goods made with fat, such as croissants, muffins, or some breads. Dry pasta or rice meal packs. Vegetables Creamed or fried vegetables. Vegetables in a cheese sauce. Regular canned vegetables (not low-sodium or reduced-sodium). Regular canned tomato sauce and paste (not low-sodium or reduced-sodium). Regular tomato and vegetable juice (not low-sodium or reduced-sodium). Pickles. Olives. Fruits Canned fruit in a light or heavy syrup. Fried fruit. Fruit in cream or butter sauce. Meat and other protein foods Fatty cuts of meat. Ribs. Fried meat. Bacon. Sausage. Bologna and other processed lunch meats. Salami. Fatback. Hotdogs. Bratwurst. Salted nuts and seeds. Canned beans with added salt. Canned or smoked fish. Whole eggs or egg yolks. Chicken or turkey with skin. Dairy Whole or 2% milk, cream, and half-and-half. Whole or full-fat cream cheese. Whole-fat or sweetened yogurt. Full-fat cheese. Nondairy creamers. Whipped toppings.  Processed cheese and cheese spreads. Fats and oils Butter. Stick margarine. Lard. Shortening. Ghee. Bacon fat. Tropical oils, such as coconut, palm kernel, or palm oil. Seasoning and other foods Salted popcorn and pretzels. Onion salt, garlic salt, seasoned salt, table salt, and sea salt. Worcestershire sauce. Tartar sauce. Barbecue sauce. Teriyaki sauce. Soy sauce, including reduced-sodium. Steak sauce. Canned and packaged gravies. Fish sauce. Oyster sauce. Cocktail sauce. Horseradish that you find on the shelf. Ketchup. Mustard. Meat flavorings and tenderizers. Bouillon cubes. Hot sauce and Tabasco sauce. Premade or packaged marinades. Premade or packaged taco seasonings. Relishes. Regular salad dressings. Where to find more information:  National Heart, Lung, and Blood Institute: www.nhlbi.nih.gov  American Heart Association: www.heart.org Summary  The DASH eating plan is a healthy eating plan that has been shown to reduce high blood pressure (hypertension). It may also reduce your risk for type 2 diabetes, heart disease, and stroke.  With the DASH eating plan, you should limit salt (sodium) intake to 2,300 mg a day. If you have hypertension, you may need to reduce your sodium intake to 1,500 mg a day.  When on the DASH eating plan, aim to eat more fresh fruits and vegetables, whole grains, lean proteins, low-fat dairy, and heart-healthy fats.  Work with your health care provider or diet and nutrition specialist (dietitian) to adjust your eating plan to your   individual calorie needs. This information is not intended to replace advice given to you by your health care provider. Make sure you discuss any questions you have with your health care provider. Document Revised: 03/28/2017 Document Reviewed: 04/08/2016 Elsevier Patient Education  2020 Elsevier Inc.  

## 2020-03-21 NOTE — Progress Notes (Signed)
° °  Subjective:    Patient ID: Raymond Rosales, male    DOB: 06/07/1960, 59 y.o.   MRN: [id number]  Hypertension This is a chronic problem. Pertinent negatives include no chest pain, headaches or shortness of breath. Treatments tried: Amlodipine. There are no compliance problems (Patient reports fatigue and occasional ankle swelling since starting medication. Interested in referral to a nutritionist. ).    Taken his medication working on diet trying to stay active   Review of Systems  Constitutional: Negative for activity change.  HENT: Negative for congestion and rhinorrhea.   Respiratory: Negative for cough and shortness of breath.   Cardiovascular: Negative for chest pain.  Gastrointestinal: Negative for abdominal pain, diarrhea, nausea and vomiting.  Genitourinary: Negative for dysuria and hematuria.  Neurological: Negative for weakness and headaches.  Psychiatric/Behavioral: Negative for behavioral problems and confusion.       Objective:   Physical Exam Vitals reviewed.  Constitutional:      General: He is not in acute distress. HENT:     Head: Normocephalic and atraumatic.  Eyes:     General:        Right eye: No discharge.        Left eye: No discharge.  Neck:     Trachea: No tracheal deviation.  Cardiovascular:     Rate and Rhythm: Normal rate and regular rhythm.     Heart sounds: Normal heart sounds. No murmur heard.   Pulmonary:     Effort: Pulmonary effort is normal. No respiratory distress.     Breath sounds: Normal breath sounds.  Lymphadenopathy:     Cervical: No cervical adenopathy.  Skin:    General: Skin is warm and dry.  Neurological:     Mental Status: He is alert.     Coordination: Coordination normal.  Psychiatric:        Behavior: Behavior normal.   Minimal ankle edema        Assessment & Plan:  Blood pressure good continue on current medication Referral to nutritionist hopefully over time will lose a little more weight Continue  current measures Wellness exam by February Blood pressure good control continue current measures  Follow-up sooner problems.

## 2020-04-10 ENCOUNTER — Encounter: Payer: Self-pay | Admitting: Family Medicine

## 2020-05-08 ENCOUNTER — Other Ambulatory Visit: Payer: Self-pay

## 2020-05-08 ENCOUNTER — Encounter: Payer: BC Managed Care – PPO | Attending: Family Medicine | Admitting: Nutrition

## 2020-05-08 DIAGNOSIS — I10 Essential (primary) hypertension: Secondary | ICD-10-CM | POA: Insufficient documentation

## 2020-05-08 DIAGNOSIS — E782 Mixed hyperlipidemia: Secondary | ICD-10-CM

## 2020-05-08 NOTE — Progress Notes (Addendum)
[facility] visit   Medical Nutrition Therapy  Appointment Start time:  1300  Appointment End time:  1400  Primary concerns today: High Blood Pressure and High Cholesteorl   Referral diagnosis: e66.9, e78.0 Preferred learning style:  no preference indicated  Learning readiness:  change in progress  Walks daily and uses his bike  BP has been up 5-6 years. On BP medication started last year. Doesn't use salt. 10 mg Amolipine in Oct 2021.  Physical Feb 2nd. 2021 Blood work and cholesterol done.   NUTRITION ASSESSMENT    Anthropometrics   Wt Readings from Last 3 Encounters:  03/21/20 195 lb (88.5 kg)  02/08/20 202 lb 3.2 oz (91.7 kg)  11/29/19 195 lb (88.5 kg)   Ht Readings from Last 3 Encounters:  02/08/20 5' 8.75" (1.746 m)  05/24/19 5' 8.75" (1.746 m)  12/21/18 5' 8.75" (1.746 m)   There is no height or weight on file to calculate BMI. @BMIFA @ Facility age limit for growth percentiles is 20 years. Facility age limit for growth percentiles is 20 years.  Clinical Medical Hx: HTN, Hyperlipidemia Medications: Amlodipine  Labs:  CMP Latest Ref Rng & Units 11/24/2019 05/17/2019 10/14/2018  Glucose 65 - 99 mg/dL 100(H) 98 98  BUN 8 - 27 mg/dL 15 15 16   Creatinine 0.76 - 1.27 mg/dL 0.93 0.91 0.88  Sodium 134 - 144 mmol/L 141 141 142  Potassium 3.5 - 5.2 mmol/L 5.1 4.8 4.4  Chloride 96 - 106 mmol/L 104 105 105  CO2 20 - 29 mmol/L 25 27 25   Calcium 8.6 - 10.2 mg/dL 9.3 9.4 9.1  Total Protein 6.0 - 8.5 g/dL - 6.4 -  Total Bilirubin 0.0 - 1.2 mg/dL - 0.4 -  Alkaline Phos 39 - 117 IU/L - 67 -  AST 0 - 40 IU/L - 17 -  ALT 0 - 44 IU/L - 16 -   Lipid Panel     Component Value Date/Time   CHOL 202 (H) 11/24/2019 0818   TRIG 143 11/24/2019 0818   HDL 52 11/24/2019 0818   CHOLHDL 3.9 11/24/2019 0818   CHOLHDL 5.1 04/25/2014 0731   VLDL 48 (H) 04/25/2014 0731   LDLCALC 125 (H) 11/24/2019 0818   LABVLDL 25 11/24/2019 0818    Notable Signs/Symptoms: none  Lifestyle & Dietary  Hx Works for [employer] and has a stressful job. Is active physically. He basically is eating 2 meals a day and skipping mostly lunch and then overeats at dinner time.  Estimated daily fluid intake: 64 oz Supplements: MVI  Sleep: 6-8 hrs  Stress / self-care: Job is stressful  Current average weekly physical activity: walking 45 minutes 2-3 times per week  24-Hr Dietary Recall First Meal: Oatmeal or eggs and toast, fruit  Snack:  Second Meal: yogurt, water  Snack: none  Third Meal: Chicken, salad, broccoli, water  Snack: nuts  Beverages: water   Estimated Energy Needs Calories: 2000  Carbohydrate: 225g Protein: 150g Fat: 90g   NUTRITION DIAGNOSIS  NI-5.4 Decreased nutrient needs (specify): Sodium As related to Hypertension.  As evidenced by BP readings above goals and on medications..   NUTRITION INTERVENTION  Nutrition education (E-1) on the following topics:  . Low Salt Diet . Reading food labels . My Plate . Low Cholesterol Diet .   Handouts Provided Include   Low Cholesterol Nutrition Therapy  Hypertension Nutrition Therapy,  Learning Style & Readiness for Change Teaching method utilized: Visual & Auditory  Demonstrated degree of understanding via: Teach Back  Barriers to learning/adherence to lifestyle change: none  Goals Established by Pt . Follow Myplate . Follow Low salt Handout . Increase exercise. . Reading food labels   MONITORING & EVALUATION Dietary intake, weekly physical activity, and BP  in 3-6 months.Marland Kitchen Marland Kitchen

## 2020-05-15 ENCOUNTER — Encounter: Payer: Self-pay | Admitting: Nutrition

## 2020-05-16 ENCOUNTER — Encounter: Payer: Self-pay | Admitting: Family Medicine

## 2020-05-24 ENCOUNTER — Encounter: Payer: Self-pay | Admitting: Family Medicine

## 2020-05-26 NOTE — Patient Instructions (Signed)
.   Follow Myplate . Follow Low salt Handout . Increase exercise. . Reading food labels

## 2020-05-28 LAB — CBC WITH DIFFERENTIAL/PLATELET
Absolute Monocytes: 353 cells/uL (ref 200–950)
Basophils Absolute: 52 cells/uL (ref 0–200)
Basophils Relative: 1.1 %
Eosinophils Absolute: 28 cells/uL (ref 15–500)
Eosinophils Relative: 0.6 %
HCT: 43.5 % (ref 38.5–50.0)
Hemoglobin: 15.2 g/dL (ref 13.2–17.1)
Lymphs Abs: 1904 cells/uL (ref 850–3900)
MCH: 32.2 pg (ref 27.0–33.0)
MCHC: 34.9 g/dL (ref 32.0–36.0)
MCV: 92.2 fL (ref 80.0–100.0)
MPV: 9.7 fL (ref 7.5–12.5)
Monocytes Relative: 7.5 %
Neutro Abs: 2364 cells/uL (ref 1500–7800)
Neutrophils Relative %: 50.3 %
Platelets: 267 10*3/uL (ref 140–400)
RBC: 4.72 10*6/uL (ref 4.20–5.80)
RDW: 11.8 % (ref 11.0–15.0)
Total Lymphocyte: 40.5 %
WBC: 4.7 10*3/uL (ref 3.8–10.8)

## 2020-05-28 LAB — HEPATIC FUNCTION PANEL
AG Ratio: 2 (calc) (ref 1.0–2.5)
ALT: 24 U/L (ref 9–46)
AST: 24 U/L (ref 10–35)
Albumin: 4.3 g/dL (ref 3.6–5.1)
Alkaline phosphatase (APISO): 56 U/L (ref 35–144)
Bilirubin, Direct: 0.1 mg/dL (ref 0.0–0.2)
Globulin: 2.2 g/dL (calc) (ref 1.9–3.7)
Indirect Bilirubin: 0.5 mg/dL (calc) (ref 0.2–1.2)
Total Bilirubin: 0.6 mg/dL (ref 0.2–1.2)
Total Protein: 6.5 g/dL (ref 6.1–8.1)

## 2020-05-28 LAB — CARDIO IQ(R) ADVANCED LIPID PANEL
Apolipoprotein B: 93 mg/dL — ABNORMAL HIGH (ref <90)
Cholesterol: 185 mg/dL (ref <200)
HDL: 52 mg/dL (ref >39)
LDL Cholesterol (Calc): 110 mg/dL (calc) — ABNORMAL HIGH (ref <100)
LDL Large: 5575 nmol/L — ABNORMAL LOW (ref >6729)
LDL Medium: 354 nmol/L — ABNORMAL HIGH (ref <215)
LDL Particle Number: 1404 nmol/L — ABNORMAL HIGH (ref <1138)
LDL Peak Size: 219.3 Angstrom — ABNORMAL LOW (ref >222.9)
LDL Small: 239 nmol/L — ABNORMAL HIGH (ref <142)
Lipoprotein (a): <10 nmol/L (ref <75)
Non-HDL Cholesterol (Calc): 133 mg/dL (calc) — ABNORMAL HIGH (ref <130)
Total CHOL/HDL Ratio: 3.6 calc — ABNORMAL HIGH (ref <3.6)
Triglycerides: 121 mg/dL (ref <150)

## 2020-05-28 LAB — BASIC METABOLIC PANEL
BUN: 14 mg/dL (ref 7–25)
CO2: 29 mmol/L (ref 20–32)
Calcium: 9 mg/dL (ref 8.6–10.3)
Chloride: 105 mmol/L (ref 98–110)
Creat: 0.97 mg/dL (ref 0.70–1.25)
Glucose, Bld: 90 mg/dL (ref 65–99)
Potassium: 4.3 mmol/L (ref 3.5–5.3)
Sodium: 140 mmol/L (ref 135–146)

## 2020-05-28 LAB — PSA: PSA: 0.91 ng/mL (ref <=4.0)

## 2020-05-31 ENCOUNTER — Other Ambulatory Visit: Payer: Self-pay

## 2020-05-31 ENCOUNTER — Encounter: Payer: Self-pay | Admitting: Family Medicine

## 2020-05-31 ENCOUNTER — Ambulatory Visit (INDEPENDENT_AMBULATORY_CARE_PROVIDER_SITE_OTHER): Payer: BC Managed Care – PPO | Admitting: Family Medicine

## 2020-05-31 VITALS — BP 126/88 | HR 92 | Temp 97.0°F | Ht 69.0 in | Wt 195.0 lb

## 2020-05-31 DIAGNOSIS — E782 Mixed hyperlipidemia: Secondary | ICD-10-CM

## 2020-05-31 DIAGNOSIS — I10 Essential (primary) hypertension: Secondary | ICD-10-CM | POA: Diagnosis not present

## 2020-05-31 DIAGNOSIS — Z0000 Encounter for general adult medical examination without abnormal findings: Secondary | ICD-10-CM | POA: Diagnosis not present

## 2020-05-31 MED ORDER — ROSUVASTATIN CALCIUM 5 MG PO TABS: 5.0000 mg | ORAL_TABLET | Freq: Every day | ORAL | 1 refills | Status: DC

## 2020-05-31 MED ORDER — AMLODIPINE BESYLATE 5 MG PO TABS
5.0000 mg | ORAL_TABLET | Freq: Every day | ORAL | 5 refills | Status: DC
Start: 2020-05-31 — End: 2020-09-26

## 2020-05-31 NOTE — Patient Instructions (Addendum)
Shingrix and shingles prevention: know the facts!   Shingrix is a very effective vaccine to prevent shingles.   Shingles is a reactivation of chickenpox -more than 99% of Americans born before 1980 have had chickenpox even if they do not remember it. One in every 10 people who get shingles have severe long-lasting nerve pain as a result.   33 out of a 100 older adults will get shingles if they are unvaccinated.     This vaccine is very important for your health This vaccine is indicated for anyone 50 years or older. You can get this vaccine even if you have already had shingles because you can get the disease more than once in a lifetime.  Your risk for shingles and its complications increases with age.  This vaccine has 2 doses.  The second dose would be 2 to 6 months after the first dose.  If you had Zostavax vaccine in the past you should still get Shingrix. ( Zostavax is only 70% effective and it loses significant strength over a few years .)  This vaccine is given through the pharmacy.  The cost of the vaccine is through your insurance. The pharmacy can inform you of the total costs.  Common side effects including soreness in the arm, some redness and swelling, also some feel fatigue muscle soreness headache low-grade fever.  Side effects typically go away within 2 to 3 days. Remember-the pain from shingles can last a lifetime but these side effects of the vaccine will only last a few days at most. It is very important to get both doses in order to protect yourself fully.   Please get this vaccine at your earliest convenience at your trusted pharmacy.  Labs in 8 weeks April

## 2020-05-31 NOTE — Progress Notes (Signed)
Subjective:    Patient ID: Raymond Rosales, male    DOB: 11-13-1960, 60 y.o.   MRN: [id number]  HPI The patient comes in today for a wellness visit.  Raymond Rosales is doing a good job of watching diet as well as exercising on a regular basis he denies any setbacks or problems currently except for having some swelling in the lower legs related into the amlodipine.  Denies any other complications or issues Recently did lab work which shows LDL slightly elevated and the bad particle slightly elevated  A review of their health history was completed.  A review of medications was also completed.  Any needed refills; none at this time. Would like to talk to provider about Amlodipine 10 mg. Has began having ankle swelling and tingling in ankles   Eating habits: healthy eating   Falls/  MVA accidents in past few months: none  Regular exercise: uses exercise bike daily   Specialist pt sees on regular basis: none  Preventative health issues were discussed.   Additional concerns: concerns with bp and meds. (Pt has sent my chart message and pictures) Dr.Cotter will be sending results from eye exam. 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 Pt had exam today.    Results for orders placed or performed in visit on 03/21/20  Cardio IQ (R) Advanced Lipid Panel  Result Value Ref Range   Cholesterol 185 <200 mg/dL   HDL 52 >39 mg/dL   Triglycerides 121 <150 mg/dL   LDL Cholesterol (Calc) 110 (H) <100 mg/dL (calc)   Total CHOL/HDL Ratio 3.6 (H) <3.6 calc   Non-HDL Cholesterol (Calc) 133 (H) <130 mg/dL (calc)   LDL Particle Number 1,404 (H) <1,138 nmol/L   LDL Small 239 (H) <142 nmol/L   LDL Medium 354 (H) <215 nmol/L   LDL Large 5,575 (L) >6,729 nmol/L   LDL Pattern A A Pattern   LDL Peak Size 219.3 (L) >222.9 Angstrom   Apolipoprotein B 93 (H) <90 mg/dL   Lipoprotein (a) <10 <75 nmol/L  Hepatic function panel  Result Value Ref Range   Total Protein 6.5 6.1 - 8.1 g/dL   Albumin 4.3 3.6 - 5.1 g/dL    Globulin 2.2 1.9 - 3.7 g/dL (calc)   AG Ratio 2.0 1.0 - 2.5 (calc)   Total Bilirubin 0.6 0.2 - 1.2 mg/dL   Bilirubin, Direct 0.1 0.0 - 0.2 mg/dL   Indirect Bilirubin 0.5 0.2 - 1.2 mg/dL (calc)   Alkaline phosphatase (APISO) 56 35 - 144 U/L   AST 24 10 - 35 U/L   ALT 24 9 - 46 U/L  Basic metabolic panel  Result Value Ref Range   Glucose, Bld 90 65 - 99 mg/dL   BUN 14 7 - 25 mg/dL   Creat 0.97 0.70 - 1.25 mg/dL   BUN/Creatinine Ratio NOT APPLICABLE 6 - 22 (calc)   Sodium 140 135 - 146 mmol/L   Potassium 4.3 3.5 - 5.3 mmol/L   Chloride 105 98 - 110 mmol/L   CO2 29 20 - 32 mmol/L   Calcium 9.0 8.6 - 10.3 mg/dL  PSA  Result Value Ref Range   PSA 0.91 < OR = 4.0 ng/mL  CBC with Differential/Platelet  Result Value Ref Range   WBC 4.7 3.8 - 10.8 Thousand/uL   RBC 4.72 4.20 - 5.80 Million/uL   Hemoglobin 15.2 13.2 - 17.1 g/dL   HCT 43.5 38.5 - 50.0 %   MCV 92.2 80.0 - 100.0 fL   MCH 32.2 27.0 -  33.0 pg   MCHC 34.9 32.0 - 36.0 g/dL   RDW 11.8 11.0 - 15.0 %   Platelets 267 140 - 400 Thousand/uL   MPV 9.7 7.5 - 12.5 fL   Neutro Abs 2,364 1,500 - 7,800 cells/uL   Lymphs Abs 1,904 850 - 3,900 cells/uL   Absolute Monocytes 353 200 - 950 cells/uL   Eosinophils Absolute 28 15 - 500 cells/uL   Basophils Absolute 52 0 - 200 cells/uL   Neutrophils Relative % 50.3 %   Total Lymphocyte 40.5 %   Monocytes Relative 7.5 %   Eosinophils Relative 0.6 %   Basophils Relative 1.1 %    Review of Systems  Constitutional: Negative for activity change, appetite change and fever.  HENT: Negative for congestion and rhinorrhea.   Eyes: Negative for discharge.  Respiratory: Negative for cough and wheezing.   Cardiovascular: Negative for chest pain.  Gastrointestinal: Negative for abdominal pain, blood in stool and vomiting.  Genitourinary: Negative for difficulty urinating and frequency.  Musculoskeletal: Negative for neck pain.  Skin: Negative for rash.  Allergic/Immunologic: Negative for  environmental allergies and food allergies.  Neurological: Negative for weakness and headaches.  Psychiatric/Behavioral: Negative for agitation.       Objective:   Physical Exam Constitutional:      Appearance: He is well-developed and well-nourished.  HENT:     Head: Normocephalic and atraumatic.     Right Ear: External ear normal.     Left Ear: External ear normal.     Nose: Nose normal.     Mouth/Throat:     Mouth: Oropharynx is clear and moist.  Eyes:     Extraocular Movements: EOM normal.     Pupils: Pupils are equal, round, and reactive to light.  Neck:     Thyroid: No thyromegaly.  Cardiovascular:     Rate and Rhythm: Normal rate and regular rhythm.     Heart sounds: Normal heart sounds. No murmur heard.   Pulmonary:     Effort: Pulmonary effort is normal. No respiratory distress.     Breath sounds: Normal breath sounds. No wheezing.  Abdominal:     General: Bowel sounds are normal. There is no distension.     Palpations: Abdomen is soft. There is no mass.     Tenderness: There is no abdominal tenderness.  Genitourinary:    Penis: Normal.      Prostate: Normal.  Musculoskeletal:        General: No edema. Normal range of motion.     Cervical back: Normal range of motion and neck supple.  Lymphadenopathy:     Cervical: No cervical adenopathy.  Skin:    General: Skin is warm and dry.     Findings: No erythema.  Neurological:     Mental Status: He is alert.     Motor: No abnormal muscle tone.  Psychiatric:        Mood and Affect: Mood and affect normal.        Behavior: Behavior normal.        Judgment: Judgment normal.      Prostate exam normal      Assessment & Plan:  Adult wellness-complete.wellness physical was conducted today. Importance of diet and exercise were discussed in detail.  In addition to this a discussion regarding safety was also covered. We also reviewed over immunizations and gave recommendations regarding current immunization needed  for age.  In addition to this additional areas were also touched on including: Preventative health exams  needed:  Colonoscopy he is due in 2024 Shingrix vaccine recommended   Patient was advised yearly wellness exam 1. Essential hypertension, benign Blood pressure decent control but he is having pedal edema because of the 10 mg amlodipine he is watching his diet he is exercising he would like to reduce the medication this is reasonable we will try 5 mg he will send [place] readings in a few weeks may need to add a full [place] or other measure - Lipid Profile - Hepatic function panel  2. Mixed hyperlipidemia Crestor 5 mg daily first 2 weeks 3 times a week then after that 1 every day check lab work in 8 to 12 weeks follow-up sooner if progressive troubles or problems warning signs discussed - Lipid Profile - Hepatic function panel  3. Well adult exam See above next wellness checkup will be in 1 year

## 2020-06-15 ENCOUNTER — Encounter: Payer: Self-pay | Admitting: Family Medicine

## 2020-07-21 ENCOUNTER — Encounter: Payer: Self-pay | Admitting: Family Medicine

## 2020-08-07 ENCOUNTER — Encounter: Payer: Self-pay | Admitting: Family Medicine

## 2020-08-30 LAB — HEPATIC FUNCTION PANEL
ALT: 22 IU/L (ref 0–44)
AST: 18 IU/L (ref 0–40)
Albumin: 4.7 g/dL (ref 3.8–4.8)
Alkaline Phosphatase: 66 IU/L (ref 44–121)
Bilirubin Total: 0.5 mg/dL (ref 0.0–1.2)
Bilirubin, Direct: 0.14 mg/dL (ref 0.00–0.40)
Total Protein: 6.7 g/dL (ref 6.0–8.5)

## 2020-08-30 LAB — LIPID PANEL
Chol/HDL Ratio: 2.5 ratio (ref 0.0–5.0)
Cholesterol, Total: 132 mg/dL (ref 100–199)
HDL: 52 mg/dL (ref >39)
LDL Chol Calc (NIH): 65 mg/dL (ref 0–99)
Triglycerides: 76 mg/dL (ref 0–149)
VLDL Cholesterol Cal: 15 mg/dL (ref 5–40)

## 2020-09-26 ENCOUNTER — Ambulatory Visit (INDEPENDENT_AMBULATORY_CARE_PROVIDER_SITE_OTHER): Payer: BC Managed Care – PPO | Admitting: Family Medicine

## 2020-09-26 ENCOUNTER — Encounter: Payer: Self-pay | Admitting: Family Medicine

## 2020-09-26 ENCOUNTER — Other Ambulatory Visit: Payer: Self-pay

## 2020-09-26 VITALS — BP 136/82 | HR 70 | Temp 97.2°F | Ht 69.0 in | Wt 205.0 lb

## 2020-09-26 DIAGNOSIS — N410 Acute prostatitis: Secondary | ICD-10-CM

## 2020-09-26 DIAGNOSIS — R5383 Other fatigue: Secondary | ICD-10-CM

## 2020-09-26 DIAGNOSIS — R350 Frequency of micturition: Secondary | ICD-10-CM | POA: Diagnosis not present

## 2020-09-26 LAB — POCT URINALYSIS DIPSTICK
Spec Grav, UA: <=1.005 — AB (ref 1.010–1.025)
pH, UA: 5 (ref 5.0–8.0)

## 2020-09-26 MED ORDER — SULFAMETHOXAZOLE-TRIMETHOPRIM 800-160 MG PO TABS: 1.0000 | ORAL_TABLET | Freq: Two times a day (BID) | ORAL | 0 refills | Status: DC

## 2020-09-26 MED ORDER — AMLODIPINE BESYLATE 5 MG PO TABS: 5.0000 mg | ORAL_TABLET | Freq: Every day | ORAL | 1 refills | Status: DC

## 2020-09-26 NOTE — Patient Instructions (Signed)
Prostatitis  Prostatitis is swelling of the prostate gland, also called the prostate. This gland is about 1.5 inches wide and 1 inch high, and it helps to make a fluid called semen. The prostate is below a man's bladder, in front of the butt (rectum). There are different types of prostatitis. What are the causes? One type of prostatitis is caused by an infection from germs (bacteria). Another type is not caused by germs. It may be caused by:  Things having to do with the nervous system. This system includes thebrain, spinal cord, and nerves.  An autoimmune response. This happens when the body's disease-fighting system attacks healthy tissue in the body by mistake.  Psychological factors. These have to do with how the mind works. The causes of other types of prostatitis are normally not known. What are the signs or symptoms? Symptoms of this condition depend on the type of prostatitis you have. If your condition is caused by germs:  You may feel pain or burning when you pee (urinate).  You may pee often and all of a sudden.  You may have problems starting to pee.  You may have trouble emptying your bladder when you pee.  You may have fever or chills.  You may feel pain in your muscles, joints, low back, or lower belly. If you have other types of prostatitis:  You may pee often or all of a sudden.  You may have trouble starting to pee.  You may have a weak flow when you pee.  You may leak pee after using the bathroom.  You may have other problems, such as: ? Abnormal fluid coming from the penis. ? Pain in the testicles or penis. ? Pain between the butt and the testicles. ? Pain when fluid comes out of the penis during sex. How is this treated? Treatment for this condition depends on the type of prostatitis. Treatment may include:  Medicines. These may treat pain or swelling, or they may help relax muscles.  Exercises to help you move better or get stronger (physical  therapy).  Heat therapy.  Techniques to help you control some of the ways that your body works.  Exercises to help you relax.  Antibiotic medicine, if your condition is caused by germs.  Warm water baths (sitz baths) to relax muscles. Follow these instructions at home: Medicines  Take over-the-counter and prescription medicines only as told by your doctor.  If you were prescribed an antibiotic medicine, take it as told by your doctor. Do not stop using the antibiotic even if you start to feel better. Managing pain and swelling  Take sitz baths as told by your doctor. For a sitz bath, sit in warm water that is deep enough to cover your hips and butt.  If told, put heat on the painful area. Do this as often as told by your doctor. Use the heat source that your doctor recommends, such as a moist heat pack or a heating pad. ? Place a towel between your skin and the heat source. ? Leave the heat on for 20-30 minutes. ? Take off the heat if your skin turns bright red. This is very important if you are unable to feel pain, heat, or cold. You may have a greater risk of getting burned.   General instructions  Do exercises as told by your doctor, if your doctor prescribed them.  Keep all follow-up visits as told by your doctor. This is important. Where to find more information  [employer]  of Diabetes and Digestive and Kidney Diseases: http://www.bass.com/ Contact a doctor if:  Your symptoms get worse.  You have a fever. Get help right away if:  You have chills.  You feel light-headed.  You feel like you may faint.  You cannot pee.  You have blood or clumps of blood (blood clots) in your pee. Summary  Prostatitis is swelling of the prostate gland.  There are different types of prostatitis. Treatment depends on the type that you have.  Take over-the-counter and prescription medicines only as told by your doctor.  Get help right away of you have chills, feel  light-headed, or feel like you may faint. Also get help right away if you cannot pee or you have blood or clumps of blood in your pee. This information is not intended to replace advice given to you by your health care provider. Make sure you discuss any questions you have with your health care provider. Document Revised: 05/21/2019 Document Reviewed: 05/21/2019 Elsevier Patient Education  2021 [employer].

## 2020-09-26 NOTE — Progress Notes (Signed)
   Subjective:    Patient ID: Raymond Rosales, male    DOB: 1961/04/11, 60 y.o.   MRN: [id number]  HPIfatigue since starting rosuvastatin in January.  Patient relates a lot of fatigue since starting on rosuvastatin on a daily basis. Chills and frequent urination started 3 days ago. Has been waking once a night to urin call ate for the past month or two.   Results for orders placed or performed in visit on 09/26/20  POCT urinalysis dipstick  Result Value Ref Range   Color, UA     Clarity, UA     Glucose, UA     Bilirubin, UA     Ketones, UA     Spec Grav, UA <=1.005 (A) 1.010 - 1.025   Blood, UA     pH, UA 5.0 5.0 - 8.0   Protein, UA     Urobilinogen, UA     Nitrite, UA     Leukocytes, UA     Appearance     Odor        Review of Systems     Objective:   Physical Exam Lungs clear heart regular pulse normal HEENT benign Mild prostate enlargement moderate tenderness      Assessment & Plan:  Hyperlipidemia Doing better on rosuvastatin but now having fatigue Tried co-Q10 Reduce the medication rosuvastatin Monday Wednesday Friday  Follow-up by fall time with lab work  Prostatitis Bactrim for 3 weeks twice daily if rash blistering sores or other problems with medicine stop immediately  Patient to watch closely for any signs of high fevers chills or other problems

## 2020-11-28 ENCOUNTER — Encounter: Payer: Self-pay | Admitting: Family Medicine

## 2020-11-28 ENCOUNTER — Ambulatory Visit (INDEPENDENT_AMBULATORY_CARE_PROVIDER_SITE_OTHER): Payer: BC Managed Care – PPO | Admitting: Family Medicine

## 2020-11-28 ENCOUNTER — Other Ambulatory Visit: Payer: Self-pay

## 2020-11-28 VITALS — BP 120/80 | HR 61 | Ht 69.0 in | Wt 200.0 lb

## 2020-11-28 DIAGNOSIS — I10 Essential (primary) hypertension: Secondary | ICD-10-CM | POA: Diagnosis not present

## 2020-11-28 MED ORDER — AMLODIPINE BESYLATE 5 MG PO TABS: 5.0000 mg | ORAL_TABLET | Freq: Every day | ORAL | 1 refills | Status: DC

## 2020-11-28 MED ORDER — ROSUVASTATIN CALCIUM 5 MG PO TABS: 5.0000 mg | ORAL_TABLET | Freq: Every day | ORAL | 1 refills | Status: DC

## 2020-11-28 NOTE — Progress Notes (Signed)
   Subjective:    Patient ID: Raymond Rosales, male    DOB: 03-Dec-1960, 60 y.o.   MRN: [id number]  Hypertension  Follow up  Patient for blood pressure check up.  The patient does have hypertension.  The patient is on medication.  Patient relates compliance with meds. Todays BP reviewed with the patient. Patient denies issues with medication. Patient relates reasonable diet. Patient tries to minimize salt. Patient aware of BP goals.   Review of Systems     Objective:   Physical Exam General-in no acute distress Eyes-no discharge Lungs-respiratory rate normal, CTA CV-no murmurs,RRR Extremities skin warm dry no edema Neuro grossly normal Behavior normal, alert        Assessment & Plan:  HTN- patient seen for follow-up regarding HTN.  Diet, medication compliance, appropriate labs and refills were completed.  Importance of keeping blood pressure under good control to lessen the risk of complications discussed  No longer taking co q 10 caused anxiety  Hyperlipidemia-importance of diet, weight control, activity, compliance with medications discussed.  Recent labs reviewed.  Any additional labs or refills ordered.  Importance of keeping under good control discussed.  Wellness in [place] with labs

## 2021-02-07 ENCOUNTER — Other Ambulatory Visit: Payer: Self-pay

## 2021-02-07 ENCOUNTER — Encounter: Payer: Self-pay | Admitting: Family Medicine

## 2021-02-07 ENCOUNTER — Ambulatory Visit: Payer: BC Managed Care – PPO | Admitting: Family Medicine

## 2021-02-07 VITALS — BP 134/84 | Temp 97.2°F | Wt 203.0 lb

## 2021-02-07 DIAGNOSIS — K5792 Diverticulitis of intestine, part unspecified, without perforation or abscess without bleeding: Secondary | ICD-10-CM

## 2021-02-07 DIAGNOSIS — R1032 Left lower quadrant pain: Secondary | ICD-10-CM

## 2021-02-07 MED ORDER — CIPROFLOXACIN HCL 500 MG PO TABS: 500.0000 mg | ORAL_TABLET | Freq: Two times a day (BID) | ORAL | 0 refills | Status: DC

## 2021-02-07 MED ORDER — METRONIDAZOLE 500 MG PO TABS: 500.0000 mg | ORAL_TABLET | Freq: Three times a day (TID) | ORAL | 0 refills | Status: AC

## 2021-02-07 NOTE — Progress Notes (Signed)
   Subjective:    Patient ID: Raymond Rosales, male    DOB: [birth year], 60 y.o.   MRN: [id number]  HPI Pt has been having issues with feeling bloated past couple of days. Oatmeal and cheerios for breakfast. Yesterday at lunch had leftover ribs. Hurting in left upper area.;last night Pain moved down to lower left quadrant. Pt states it felt like he had to go to the bathroom but movement was "incomplete". Cramping last night. Pt has been on Rosuvastatin 5 mg 5 days a week. Pt has been eating out a lot more due to school. Tender in left lower quadrant. Cramps during the night. Took Dulcolax.   Patient relates that the days gone on today he is noticed more of the left lower abdominal pain and discomfort.  He denies sweats chills vomiting.  Denies bloody stools states more loose possibly a little bit of mucus in  Review of Systems     Objective:   Physical Exam General-in no acute distress Eyes-no discharge Lungs-respiratory rate normal, CTA CV-no murmurs,RRR Extremities skin warm dry no edema Neuro grossly normal Behavior normal, alert Tender lower abdomen on the left lower quadrant       Assessment & Plan:   Left lower abdominal pain Probable diverticulitis Not surgical abdomen Cipro and Flagyl as directed Labs ordered Recheck 48 hours If deteriorating would need CT scan

## 2021-02-08 LAB — CBC WITH DIFFERENTIAL/PLATELET
Basophils Absolute: 0 10*3/uL (ref 0.0–0.2)
Basos: 0 %
EOS (ABSOLUTE): 0 10*3/uL (ref 0.0–0.4)
Eos: 0 %
Hematocrit: 47.1 % (ref 37.5–51.0)
Hemoglobin: 15.8 g/dL (ref 13.0–17.7)
Immature Grans (Abs): 0 10*3/uL (ref 0.0–0.1)
Immature Granulocytes: 0 %
Lymphocytes Absolute: 1.5 10*3/uL (ref 0.7–3.1)
Lymphs: 14 %
MCH: 30.9 pg (ref 26.6–33.0)
MCHC: 33.5 g/dL (ref 31.5–35.7)
MCV: 92 fL (ref 79–97)
Monocytes Absolute: 0.6 10*3/uL (ref 0.1–0.9)
Monocytes: 6 %
Neutrophils Absolute: 8.5 10*3/uL — ABNORMAL HIGH (ref 1.4–7.0)
Neutrophils: 80 %
Platelets: 258 10*3/uL (ref 150–450)
RBC: 5.11 x10E6/uL (ref 4.14–5.80)
RDW: 11.8 % (ref 11.6–15.4)
WBC: 10.7 10*3/uL (ref 3.4–10.8)

## 2021-02-08 LAB — COMPREHENSIVE METABOLIC PANEL
ALT: 23 IU/L (ref 0–44)
AST: 23 IU/L (ref 0–40)
Albumin/Globulin Ratio: 1.8 (ref 1.2–2.2)
Albumin: 4.6 g/dL (ref 3.8–4.8)
Alkaline Phosphatase: 77 IU/L (ref 44–121)
BUN/Creatinine Ratio: 18 (ref 10–24)
BUN: 16 mg/dL (ref 8–27)
Bilirubin Total: 0.7 mg/dL (ref 0.0–1.2)
CO2: 25 mmol/L (ref 20–29)
Calcium: 9.4 mg/dL (ref 8.6–10.2)
Chloride: 101 mmol/L (ref 96–106)
Creatinine, Ser: 0.87 mg/dL (ref 0.76–1.27)
Globulin, Total: 2.5 g/dL (ref 1.5–4.5)
Glucose: 92 mg/dL (ref 70–99)
Potassium: 4.1 mmol/L (ref 3.5–5.2)
Sodium: 137 mmol/L (ref 134–144)
Total Protein: 7.1 g/dL (ref 6.0–8.5)
eGFR: 98 mL/min/{1.73_m2} (ref >59)

## 2021-02-08 LAB — LIPASE: Lipase: 63 U/L (ref 13–78)

## 2021-02-09 ENCOUNTER — Ambulatory Visit: Payer: BC Managed Care – PPO | Admitting: Family Medicine

## 2021-02-09 ENCOUNTER — Other Ambulatory Visit: Payer: Self-pay

## 2021-02-09 ENCOUNTER — Encounter: Payer: Self-pay | Admitting: Family Medicine

## 2021-02-09 VITALS — BP 120/82 | Temp 97.2°F | Wt 203.0 lb

## 2021-02-09 DIAGNOSIS — R1032 Left lower quadrant pain: Secondary | ICD-10-CM | POA: Diagnosis not present

## 2021-02-09 DIAGNOSIS — Z1211 Encounter for screening for malignant neoplasm of colon: Secondary | ICD-10-CM | POA: Diagnosis not present

## 2021-02-09 DIAGNOSIS — K5792 Diverticulitis of intestine, part unspecified, without perforation or abscess without bleeding: Secondary | ICD-10-CM | POA: Diagnosis not present

## 2021-02-09 NOTE — Progress Notes (Signed)
   Subjective:    Patient ID: Raymond Rosales, male    DOB: 10/16/1960, 60 y.o.   MRN: [id number]  HPI Pt here for follow up on diverticulitis. [name] he is much better. Still having some tenderness but nothing like it was. Taking antibiotics as prescribed.  Patient is having some bowel movements that are loose and black but no obvious blood Questions regarding flu shot and 4th covid shot  Review of Systems     Objective:   Physical Exam Lungs are clear heart regular abdomen soft minimal lower abdominal tenderness Better than what he was the other day      Assessment & Plan:  Abdominal pain Probable diverticulitis Improving Continue both antibiotics C. difficile was discussed but I do not feel he has this Recommend healthy eating, probiotic, continue medication, recheck in 2 weeks time  Flu vaccine COVID-vaccine to get done this fall

## 2021-02-14 ENCOUNTER — Other Ambulatory Visit: Payer: Self-pay

## 2021-02-14 DIAGNOSIS — Z1211 Encounter for screening for malignant neoplasm of colon: Secondary | ICD-10-CM

## 2021-02-14 LAB — IFOBT (OCCULT BLOOD): IFOBT: NEGATIVE

## 2021-02-26 ENCOUNTER — Encounter: Payer: Self-pay | Admitting: Family Medicine

## 2021-02-26 ENCOUNTER — Other Ambulatory Visit: Payer: Self-pay

## 2021-02-26 ENCOUNTER — Ambulatory Visit: Payer: BC Managed Care – PPO | Admitting: Family Medicine

## 2021-02-26 VITALS — BP 132/74 | HR 63 | Temp 97.0°F | Ht 69.0 in | Wt 203.0 lb

## 2021-02-26 DIAGNOSIS — Z23 Encounter for immunization: Secondary | ICD-10-CM

## 2021-02-26 DIAGNOSIS — M545 Low back pain, unspecified: Secondary | ICD-10-CM

## 2021-02-26 NOTE — Patient Instructions (Signed)
Ibuprofen 200 mg  Take 2 three times daily for 5 to 7 days  Do your stretches 5 to 10 minutes twice daily  If not gone within 6 weeks then recheck

## 2021-02-26 NOTE — Progress Notes (Signed)
   Subjective:    Patient ID: Raymond Rosales, male    DOB: 12/22/1960, 60 y.o.   MRN: [id number]  HPI  LLQ abd pain - diverticulitis follow up doing very well not having any pain or discomfort no vomiting diarrhea or bloody stools  Back pain - over exertion w/ acitivity x 1 week  OTC and tens unit, heating pad used relates pain was on the left side now on the right side does not radiate into the buttock does not radiate down the leg.  Hurts with certain movements.  Has tried some stretches. Review of Systems     Objective:   Physical Exam General-in no acute distress Eyes-no discharge Lungs-respiratory rate normal, CTA CV-no murmurs,RRR Extremities skin warm dry no edema Neuro grossly normal Behavior normal, alert  Positive straight leg raise on the right negative on the left low back mild tenderness      Assessment & Plan:  Some increased back pain with straight leg raise-does not have true sciatica but we will watch things closely if not dramatically better within 6 weeks consider physical therapy and MRI patient to give [place] follow-up Conservative measures discussed including OTC anti-inflammatory plus printed stretches was given to the patient  Diverticulitis-resolved continue current measures if any reoccurrence to notify us

## 2021-04-12 ENCOUNTER — Encounter: Payer: Self-pay | Admitting: Family Medicine

## 2021-05-04 ENCOUNTER — Telehealth: Payer: Self-pay | Admitting: Family Medicine

## 2021-05-04 DIAGNOSIS — Z130 Encounter for screening for diseases of the blood and blood-forming organs and certain disorders involving the immune mechanism: Secondary | ICD-10-CM

## 2021-05-04 DIAGNOSIS — Z79899 Other long term (current) drug therapy: Secondary | ICD-10-CM

## 2021-05-04 DIAGNOSIS — E782 Mixed hyperlipidemia: Secondary | ICD-10-CM

## 2021-05-04 DIAGNOSIS — Z131 Encounter for screening for diabetes mellitus: Secondary | ICD-10-CM

## 2021-05-04 DIAGNOSIS — Z125 Encounter for screening for malignant neoplasm of prostate: Secondary | ICD-10-CM

## 2021-05-04 NOTE — Telephone Encounter (Signed)
Blood work ordered in Epic. Patient notified. 

## 2021-05-04 NOTE — Telephone Encounter (Signed)
Last labs 02/07/21: Lipase, CMP, CBC

## 2021-05-04 NOTE — Telephone Encounter (Signed)
Pt has a physical 05/30/21 with Dr Nicki Reaper. Please send lab orders.

## 2021-05-04 NOTE — Telephone Encounter (Signed)
Lipid liver met 7 cbc psa Hyperlip and wellness

## 2021-05-25 LAB — LIPID PANEL
Chol/HDL Ratio: 2.7 ratio (ref 0.0–5.0)
Cholesterol, Total: 131 mg/dL (ref 100–199)
HDL: 48 mg/dL (ref >39)
LDL Chol Calc (NIH): 64 mg/dL (ref 0–99)
Triglycerides: 104 mg/dL (ref 0–149)
VLDL Cholesterol Cal: 19 mg/dL (ref 5–40)

## 2021-05-25 LAB — BASIC METABOLIC PANEL
BUN/Creatinine Ratio: 14 (ref 10–24)
BUN: 14 mg/dL (ref 8–27)
CO2: 25 mmol/L (ref 20–29)
Calcium: 9.2 mg/dL (ref 8.6–10.2)
Chloride: 103 mmol/L (ref 96–106)
Creatinine, Ser: 1 mg/dL (ref 0.76–1.27)
Glucose: 92 mg/dL (ref 70–99)
Potassium: 4.2 mmol/L (ref 3.5–5.2)
Sodium: 142 mmol/L (ref 134–144)
eGFR: 86 mL/min/{1.73_m2} (ref >59)

## 2021-05-25 LAB — CBC WITH DIFFERENTIAL/PLATELET
Basophils Absolute: 0.1 10*3/uL (ref 0.0–0.2)
Basos: 1 %
EOS (ABSOLUTE): 0.1 10*3/uL (ref 0.0–0.4)
Eos: 1 %
Hematocrit: 44.4 % (ref 37.5–51.0)
Hemoglobin: 15.3 g/dL (ref 13.0–17.7)
Immature Grans (Abs): 0 10*3/uL (ref 0.0–0.1)
Immature Granulocytes: 0 %
Lymphocytes Absolute: 1.9 10*3/uL (ref 0.7–3.1)
Lymphs: 34 %
MCH: 31.7 pg (ref 26.6–33.0)
MCHC: 34.5 g/dL (ref 31.5–35.7)
MCV: 92 fL (ref 79–97)
Monocytes Absolute: 0.4 10*3/uL (ref 0.1–0.9)
Monocytes: 8 %
Neutrophils Absolute: 3.1 10*3/uL (ref 1.4–7.0)
Neutrophils: 56 %
Platelets: 235 10*3/uL (ref 150–450)
RBC: 4.82 x10E6/uL (ref 4.14–5.80)
RDW: 12.5 % (ref 11.6–15.4)
WBC: 5.6 10*3/uL (ref 3.4–10.8)

## 2021-05-25 LAB — HEPATIC FUNCTION PANEL
ALT: 19 IU/L (ref 0–44)
AST: 22 IU/L (ref 0–40)
Albumin: 4.3 g/dL (ref 3.8–4.8)
Alkaline Phosphatase: 71 IU/L (ref 44–121)
Bilirubin Total: 0.6 mg/dL (ref 0.0–1.2)
Bilirubin, Direct: 0.14 mg/dL (ref 0.00–0.40)
Total Protein: 6.5 g/dL (ref 6.0–8.5)

## 2021-05-25 LAB — PSA: Prostate Specific Ag, Serum: 1 ng/mL (ref 0.0–4.0)

## 2021-05-30 ENCOUNTER — Encounter: Payer: Self-pay | Admitting: Family Medicine

## 2021-05-30 ENCOUNTER — Other Ambulatory Visit: Payer: Self-pay

## 2021-05-30 ENCOUNTER — Ambulatory Visit: Payer: BC Managed Care – PPO | Admitting: Family Medicine

## 2021-05-30 VITALS — BP 128/84 | Temp 97.9°F | Ht 69.0 in | Wt 203.0 lb

## 2021-05-30 DIAGNOSIS — Z0001 Encounter for general adult medical examination with abnormal findings: Secondary | ICD-10-CM

## 2021-05-30 DIAGNOSIS — Z0000 Encounter for general adult medical examination without abnormal findings: Secondary | ICD-10-CM

## 2021-05-30 DIAGNOSIS — I10 Essential (primary) hypertension: Secondary | ICD-10-CM

## 2021-05-30 DIAGNOSIS — E782 Mixed hyperlipidemia: Secondary | ICD-10-CM

## 2021-05-30 MED ORDER — AMLODIPINE BESYLATE 5 MG PO TABS: 5.0000 mg | ORAL_TABLET | Freq: Every day | ORAL | 1 refills | Status: DC

## 2021-05-30 MED ORDER — ROSUVASTATIN CALCIUM 5 MG PO TABS: 5.0000 mg | ORAL_TABLET | Freq: Every day | ORAL | 1 refills | Status: DC

## 2021-05-30 NOTE — Progress Notes (Signed)
° °  Subjective:    Patient ID: Raymond Rosales, male    DOB: April 20, 1961, 61 y.o.   MRN: [id number]  HPI The patient comes in today for a wellness visit.  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  A review of medications was also completed.  Any needed refills; Amlodipine, Rosuvastatin   Eating habits: to good  Falls/  MVA accidents in past few months: none  Regular exercise: trying to   Specialist pt sees on regular basis: none  Preventative health issues were discussed.   Additional concerns: pt is thinking of becoming a substitute teacher and has a form to fill out    Review of Systems     Objective:   Physical Exam General-in no acute distress Eyes-no discharge Lungs-respiratory rate normal, CTA CV-no murmurs,RRR Extremities skin warm dry no edema Neuro grossly normal Behavior normal, alert Umbilical hernia noted Prostate exam normal size        Assessment & Plan:  1. Well adult exam Adult wellness-complete.wellness physical was conducted today. Importance of diet and exercise were discussed in detail.  In addition to this a discussion regarding safety was also covered. We also reviewed over immunizations and gave recommendations regarding current immunization needed for age.  In addition to this additional areas were also touched on including: Preventative health exams needed:  Colonoscopy colonoscopy recommended to be done again in 2024  Patient was advised yearly wellness exam   2. Mixed hyperlipidemia Very good control continue current measures  3. 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

## 2021-05-30 NOTE — Patient Instructions (Signed)

## 2021-06-04 ENCOUNTER — Encounter: Payer: BC Managed Care – PPO | Admitting: Family Medicine

## 2021-06-07 ENCOUNTER — Encounter: Payer: Self-pay | Admitting: Family Medicine

## 2021-06-07 ENCOUNTER — Telehealth: Payer: Self-pay | Admitting: Family Medicine

## 2021-06-07 DIAGNOSIS — D229 Melanocytic nevi, unspecified: Secondary | ICD-10-CM

## 2021-06-07 NOTE — Telephone Encounter (Signed)
Nurses-patient had dark mole on foot, recommend referral to [facility] off of [address]. Not [place] [facility] office.  Thank you

## 2021-06-08 NOTE — Telephone Encounter (Signed)
Referral ordered in EPIC. 

## 2021-09-10 ENCOUNTER — Encounter: Payer: Self-pay | Admitting: Family Medicine

## 2021-09-10 ENCOUNTER — Ambulatory Visit (INDEPENDENT_AMBULATORY_CARE_PROVIDER_SITE_OTHER): Payer: BC Managed Care – PPO | Admitting: Family Medicine

## 2021-09-10 VITALS — BP 159/88 | HR 63 | Temp 97.9°F | Wt 200.0 lb

## 2021-09-10 DIAGNOSIS — J069 Acute upper respiratory infection, unspecified: Secondary | ICD-10-CM

## 2021-09-10 NOTE — Progress Notes (Signed)
? ?  Subjective:  ? ? Patient ID: ALPHONS BURGERT, male    DOB: 1960-11-04, 61 y.o.   MRN: [id number] ? ?HPI ?Pt having cough and sinus drainage. Low grade fever on Saturday. Symptoms began on Thursday last week. Has been using ibuprofen and Navage.  ?Patient with head congestion drainage sinus pressure ? ?Review of Systems ? ?   ?Objective:  ? Physical Exam ?Gen-NAD not toxic ?TMS-normal bilateral ?T- normal no redness ?Chest-CTA respiratory rate normal no crackles ?CV RRR no murmur ?Skin-warm dry ?Neuro-grossly normal ? ? ? ? ?   ?Assessment & Plan:  ?Patient starting to improve ?Acute rhinosinusitis ?Antibiotic prescribed ?Warning signs discussed ? ?

## 2021-11-27 ENCOUNTER — Encounter: Payer: Self-pay | Admitting: Family Medicine

## 2021-11-27 ENCOUNTER — Ambulatory Visit: Payer: BC Managed Care – PPO | Admitting: Family Medicine

## 2021-11-27 VITALS — BP 121/75 | HR 68 | Temp 98.1°F | Wt 202.4 lb

## 2021-11-27 DIAGNOSIS — I10 Essential (primary) hypertension: Secondary | ICD-10-CM

## 2021-11-27 DIAGNOSIS — E782 Mixed hyperlipidemia: Secondary | ICD-10-CM

## 2021-11-27 MED ORDER — AMLODIPINE BESYLATE 5 MG PO TABS
5.0000 mg | ORAL_TABLET | Freq: Every day | ORAL | 1 refills | Status: DC
Start: 2021-11-27 — End: 2022-06-03

## 2021-11-27 MED ORDER — ROSUVASTATIN CALCIUM 5 MG PO TABS: 5.0000 mg | ORAL_TABLET | Freq: Every day | ORAL | 1 refills | Status: DC

## 2021-11-27 NOTE — Progress Notes (Signed)
   Subjective:    Patient ID: Raymond Rosales, male    DOB: 06-Jan-1961, 61 y.o.   MRN: [id number]  HPI Pt arrives for follow up on HTN. Pt keeps log of blood pressure. Last few days pressure has been slightly elevated.   Pt is dealing with a lot (wife has began chemotherapy).   Pt states every now and then he will become lightheaded but it goes away as quickly as it comes.  He does take his medicine He stays physically active Under a fair amount of stress with his wife going through chemotherapy   Review of Systems     Objective:   Physical Exam General-in no acute distress Eyes-no discharge Lungs-respiratory rate normal, CTA CV-no murmurs,RRR Extremities skin warm dry no edema Neuro grossly normal Behavior normal, alert   These dizzy spells are very transient no motion with it no unilateral numbness or weakness.  It could be a transient alteration of his blood pressure it could also be stress I doubt stroke     Assessment & Plan:  Hypertension decent control continue current blood pressure medicine watch diet closely minimize salt stay physically active  Hyperlipidemia continue statin.  Previous labs look good no need to repeat labs currently We will do comprehensive lab work before his follow-up visit for his wellness in February 2024  COVID booster, flu vaccine recommended for this fall

## 2022-02-15 ENCOUNTER — Telehealth: Payer: Self-pay | Admitting: Genetic Counselor

## 2022-02-15 ENCOUNTER — Encounter: Payer: Self-pay | Admitting: Genetic Counselor

## 2022-02-15 NOTE — Telephone Encounter (Signed)
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  Appt scheduled for 11/6 at 3pm.  

## 2022-03-04 ENCOUNTER — Inpatient Hospital Stay: Payer: BC Managed Care – PPO

## 2022-03-04 ENCOUNTER — Other Ambulatory Visit: Payer: Self-pay | Admitting: Genetic Counselor

## 2022-03-04 ENCOUNTER — Other Ambulatory Visit: Payer: Self-pay

## 2022-03-04 ENCOUNTER — Inpatient Hospital Stay: Payer: BC Managed Care – PPO | Attending: Genetic Counselor | Admitting: Genetic Counselor

## 2022-03-04 DIAGNOSIS — Z8481 Family history of carrier of genetic disease: Secondary | ICD-10-CM

## 2022-03-04 DIAGNOSIS — Z800 Family history of malignant neoplasm of digestive organs: Secondary | ICD-10-CM

## 2022-03-04 DIAGNOSIS — Z8042 Family history of malignant neoplasm of prostate: Secondary | ICD-10-CM

## 2022-03-04 DIAGNOSIS — Z803 Family history of malignant neoplasm of breast: Secondary | ICD-10-CM | POA: Diagnosis not present

## 2022-03-04 DIAGNOSIS — Z8041 Family history of malignant neoplasm of ovary: Secondary | ICD-10-CM

## 2022-03-04 LAB — GENETIC SCREENING ORDER

## 2022-03-11 ENCOUNTER — Encounter: Payer: Self-pay | Admitting: Genetic Counselor

## 2022-03-11 DIAGNOSIS — Z8481 Family history of carrier of genetic disease: Secondary | ICD-10-CM | POA: Insufficient documentation

## 2022-03-11 HISTORY — DX: Family history of carrier of genetic disease: Z84.81

## 2022-03-11 NOTE — Progress Notes (Signed)
REFERRING PROVIDER: No referring provider defined for this encounter.  PRIMARY PROVIDER:  Kathyrn Drown, MD  PRIMARY REASON FOR VISIT:  1. Family history of CHEK2 gene mutation   2. Family history of breast cancer   3. Family history of prostate cancer   4. Family history of ovarian cancer   5. Family history of cancer of GI tract      HISTORY OF PRESENT ILLNESS:   Raymond Rosales, a 61 y.o. male, was seen for a [facility] cancer genetics consultation due to a maternal family history of a known CHEK2 mutation.  Raymond Rosales presents to clinic today to discuss the possibility of a hereditary predisposition to cancer, to discuss genetic testing, and to further clarify his future cancer risks, as well as potential cancer risks for family members.   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  No other cancer history was reported.    RISK FACTORS:  Colonoscopy: yes;  most recent in 2019; ~2 polyps; 5 year f/u  . Regular dermatology screening.  PSA annually.   Past Medical History:  Diagnosis Date   Family history of CHEK2 gene mutation 03/11/2022   History of colon polyps 2011   Hyperlipidemia 11/29/2019   Reflux    Seasonal allergies     Past Surgical History:  Procedure Laterality Date   COLONOSCOPY N/A 09/22/2012   Procedure: COLONOSCOPY;  Surgeon: Daneil Dolin, MD;  Location: [facility];  Service: Endoscopy;  Laterality: N/A;  7:30 AM   COLONOSCOPY N/A 12/17/2017   Procedure: COLONOSCOPY;  Surgeon: Daneil Dolin, MD;  Location: [facility];  Service: Endoscopy;  Laterality: N/A;  9:30   POLYPECTOMY  12/17/2017   Procedure: POLYPECTOMY;  Surgeon: Daneil Dolin, MD;  Location: [facility];  Service: Endoscopy;;  ascending and descending   TONSILLECTOMY     17  yrs of age     FAMILY HISTORY:  We obtained a detailed, 4-generation family history.  Significant diagnoses are listed below: Family History   Problem Relation Age of Onset   Breast cancer Mother        dx 15 and dx 39 (two primaries); CHEK2 mutation   Lung cancer Father    Ovarian cancer Maternal Aunt        dx 57s   Bladder Cancer Paternal Aunt        father's paternal half sister   Cancer Paternal Aunt        unknown type; father's maternal half sister   Prostate cancer Paternal Uncle        father's paternal half sister   Bladder Cancer Paternal Uncle        father's paternal half sister   Lymphoma Other        MGM's sister   Cancer Other        PGF's siblings w/ lung, breast, stomach cancers   Cancer Cousin        paternal cousins w/ prostate, throat, lung cancer        Mr. Dematteo mother had genetic testing which showed a mutation in CHEK2 called c.1263del (p.Ser422Valfs*15).  The pathogenic variant in RAD51C, which was previously detected in Raymond Rosales maternal aunt, was not detected in his mother's sample. No other pathogenic variants detected in [facility] Multi-Cancer +RNA Panel.  The Multi-Cancer + RNA Panel offered by [facility] includes sequencing and/or deletion/duplication analysis of the following 84 genes:  AIP*, ALK,  APC*, ATM*, AXIN2*, BAP1*, BARD1*, BLM*, BMPR1A*, BRCA1*, BRCA2*, BRIP1*, CASR, CDC73*, CDH1*, CDK4, CDKN1B*, CDKN1C*, CDKN2A, CEBPA, CHEK2*, CTNNA1*, DICER1*, DIS3L2*, EGFR, EPCAM, FH*, FLCN*, GATA2*, GPC3, GREM1, HOXB13, HRAS, KIT, MAX*, MEN1*, MET, MITF, MLH1*, MSH2*, MSH3*, MSH6*, MUTYH*, NBN*, NF1*, NF2*, NTHL1*, PALB2*, PDGFRA, PHOX2B, PMS2*, POLD1*, POLE*, POT1*, PRKAR1A*, PTCH1*, PTEN*, RAD50*, RAD51C*, RAD51D*, RB1*, RECQL4, RET, RUNX1*, SDHA*, SDHAF2*, SDHB*, SDHC*, SDHD*, SMAD4*, SMARCA4*, SMARCB1*, SMARCE1*, STK11*, SUFU*, TERC, TERT, TMEM127*, Tp53*, TSC1*, TSC2*, VHL*, WRN*, and WT1.  RNA analysis is performed for * genes.   Raymond Rosales is unaware of other previous family history of genetic testing for hereditary cancer risks.  There is no reported Ashkenazi Jewish ancestry. There is no  known consanguinity.   GENETIC COUNSELING ASSESSMENT:  Raymond Rosales is a 61 y.o. male with a maternal family history of a known CHEK2 mutation and a paternal family which is somewhat suggestive of an additional hereditary cancer syndrome. We, therefore, discussed and recommended the following at today's visit.    DISCUSSION: We discussed that 5 - 10% of cancer is hereditary.  Given his mother has a mutation in the CHEK2 gene, he has a 50% chance of having the same family mutation in [place].  We reviewed the male breast, colon, and prostate cancer risks and management strategies associated with CHEK2 mutations.  There are other genes that can be associated with hereditary breast and prostate cancer syndromes, including but not limited to BRCA1/2.  We discussed that testing is beneficial for several reasons, including knowing about other cancer risks, identifying potential screening and risk-reduction options that may be appropriate, and to understanding if other family members could be at risk for cancer and allowing them to undergo genetic testing.   We reviewed the characteristics, features and inheritance patterns of hereditary cancer syndromes. We also discussed genetic testing, including the appropriate family members to test, the process of testing, insurance coverage and turn-around-time for results. We discussed the implications of a negative, positive, and/or variant of uncertain significant result.  We recommended  Raymond Rosales pursue genetic testing for CHEK2 (given his maternal history) and other genes associated with breast, prostate, bladder, and other cancers (given his paternal history).    The Multi-Cancer + RNA Panel offered by [facility] includes sequencing and/or deletion/duplication analysis of the following 70 genes:  AIP*, ALK, APC*, ATM*, AXIN2*, BAP1*, BARD1*, BLM*, BMPR1A*, BRCA1*, BRCA2*, BRIP1*, CDC73*, CDH1*, CDK4, CDKN1B*, CDKN2A, CHEK2*, CTNNA1*, DICER1*, EPCAM, EGFR, FH*, FLCN*,  GREM1, HOXB13, KIT, LZTR1, MAX*, MBD4, MEN1*, MET, MITF, MLH1*, MSH2*, MSH3*, MSH6*, MUTYH*, NF1*, NF2*, NTHL1*, PALB2*, PDGFRA, PMS2*, POLD1*, POLE*, POT1*, PRKAR1A*, PTCH1*, PTEN*, RAD51C*, RAD51D*, RB1*, RET, SDHA*, SDHAF2*, SDHB*, SDHC*, SDHD*, SMAD4*, SMARCA4*, SMARCB1*, SMARCE1*, STK11*, SUFU*, TMEM127*, TP53*, TSC1*, TSC2*, VHL*. RNA analysis is performed for * genes.   Based on Raymond Rosales maternal family history of a CHEK2 mutation and paternal family history of cancer, he meets medical criteria for panel-based genetic testing. Despite that he meets criteria, he may still have an out of pocket cost. We discussed that if his out of pocket cost for testing is over $100, the laboratory should contact them to discuss self-pay options and/or patient pay assistance programs.    We discussed that some people do not want to undergo genetic testing due to fear of genetic discrimination.  A federal law called the Genetic Information Non-Discrimination Act (GINA) of 2008 helps protect individuals against genetic discrimination based on their genetic test results.  It impacts both health insurance and employment.  With health  insurance, it protects against increased premiums, being kicked off insurance or being forced to take a test in order to be insured.  For employment it protects against hiring, firing and promoting decisions based on genetic test results.  GINA does not apply to those in the [employer], those who work for companies with less than 15 employees, and new life insurance or long-term disability insurance policies.  Health status due to a cancer diagnosis is not protected under GINA.   PLAN: After considering the risks, benefits, and limitations, Raymond Rosales provided informed consent to pursue genetic testing and the blood sample was sent to [facility] for analysis of the Multi-Cancer +RNA Panel. Results should be available within approximately 3 weeks' time, at which point they will be  disclosed by telephone to Raymond Rosales.  Raymond Rosales will receive a summary of his genetic counseling visit and a copy of his results once available. This information will also be available in Epic.   Raymond Rosales questions were answered to his satisfaction.      Raymond Rosales M. Joette Catching, [place], [facility] Genetic Counselor [email or web address] (P) [phone]    The patient was seen for a total of 35 minutes in face-to-face genetic counseling.  He was accompanied by his sister, Amy. Drs. Lindi Adie and/or Burr Medico were available to discuss this case as needed.  _______________________________________________________________________ For Office Staff:  Number of people involved in session: 2 Was an Intern/ student involved with case: no

## 2022-03-15 ENCOUNTER — Ambulatory Visit: Payer: Self-pay | Admitting: Genetic Counselor

## 2022-03-15 ENCOUNTER — Telehealth: Payer: Self-pay | Admitting: Genetic Counselor

## 2022-03-15 DIAGNOSIS — Z1379 Encounter for other screening for genetic and chromosomal anomalies: Secondary | ICD-10-CM

## 2022-03-15 DIAGNOSIS — Z803 Family history of malignant neoplasm of breast: Secondary | ICD-10-CM

## 2022-03-15 DIAGNOSIS — Z8042 Family history of malignant neoplasm of prostate: Secondary | ICD-10-CM

## 2022-03-15 DIAGNOSIS — Z800 Family history of malignant neoplasm of digestive organs: Secondary | ICD-10-CM

## 2022-03-15 DIAGNOSIS — Z8481 Family history of carrier of genetic disease: Secondary | ICD-10-CM

## 2022-03-15 DIAGNOSIS — Z8041 Family history of malignant neoplasm of ovary: Secondary | ICD-10-CM

## 2022-03-15 NOTE — Telephone Encounter (Signed)
Revealed negative genetics.  Family CHEK2 mutation not detected in this patient's sample.     

## 2022-05-01 DIAGNOSIS — Z1379 Encounter for other screening for genetic and chromosomal anomalies: Secondary | ICD-10-CM | POA: Insufficient documentation

## 2022-05-01 NOTE — Progress Notes (Signed)
HPI:   Mr. Bloom was previously seen in the [facility] clinic due to a family history of known CHEK2 mutation and concerns regarding a hereditary predisposition to cancer. Please refer to our prior cancer genetics clinic note for more information regarding our discussion, assessment and recommendations, at the time. Mr. Roye recent genetic test results were disclosed to him, as were recommendations warranted by these results. These results and recommendations are discussed in more detail below.  CANCER HISTORY:  Mr. 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  No other cancer history was reported.      FAMILY HISTORY:  We obtained a detailed, 4-generation family history.  Significant diagnoses are listed below:      Family History  Problem Relation Age of Onset   Breast cancer Mother          dx 36 and dx 52 (two primaries); CHEK2 mutation   Lung cancer Father     Ovarian cancer Maternal Aunt          dx 19s   Bladder Cancer Paternal Aunt          father's paternal half sister   Cancer Paternal Aunt          unknown type; father's maternal half sister   Prostate cancer Paternal Uncle          father's paternal half sister   Bladder Cancer Paternal Uncle          father's paternal half sister   Lymphoma Other          MGM's sister   Cancer Other          PGF's siblings w/ lung, breast, stomach cancers   Cancer Cousin          paternal cousins w/ prostate, throat, lung cancer         Mr. Bernhard mother had genetic testing which showed a mutation in CHEK2 called c.1263del (p.Ser422Valfs*15).  The pathogenic variant in RAD51C, which was previously detected in Mr. Mauger maternal aunt, was not detected in his mother's sample. No other pathogenic variants detected in [facility] Multi-Cancer +RNA Panel.  The Multi-Cancer + RNA Panel offered by [facility] includes sequencing and/or deletion/duplication analysis of the  following 84 genes:  AIP*, ALK, APC*, ATM*, AXIN2*, BAP1*, BARD1*, BLM*, BMPR1A*, BRCA1*, BRCA2*, BRIP1*, CASR, CDC73*, CDH1*, CDK4, CDKN1B*, CDKN1C*, CDKN2A, CEBPA, CHEK2*, CTNNA1*, DICER1*, DIS3L2*, EGFR, EPCAM, FH*, FLCN*, GATA2*, GPC3, GREM1, HOXB13, HRAS, KIT, MAX*, MEN1*, MET, MITF, MLH1*, MSH2*, MSH3*, MSH6*, MUTYH*, NBN*, NF1*, NF2*, NTHL1*, PALB2*, PDGFRA, PHOX2B, PMS2*, POLD1*, POLE*, POT1*, PRKAR1A*, PTCH1*, PTEN*, RAD50*, RAD51C*, RAD51D*, RB1*, RECQL4, RET, RUNX1*, SDHA*, SDHAF2*, SDHB*, SDHC*, SDHD*, SMAD4*, SMARCA4*, SMARCB1*, SMARCE1*, STK11*, SUFU*, TERC, TERT, TMEM127*, Tp53*, TSC1*, TSC2*, VHL*, WRN*, and WT1.  RNA analysis is performed for * genes.   Mr. Polio is unaware of other previous family history of genetic testing for hereditary cancer risks.  There is no reported Ashkenazi Jewish ancestry. There is no known consanguinity.  GENETIC TEST RESULTS:  The [facility] Multi-Cancer Panel found no pathogenic mutations. The Multi-Cancer Panel offered by [facility] includes sequencing and/or deletion/duplication analysis of the following 70 genes:  AIP*, ALK, APC*, ATM*, AXIN2*, BAP1*, BARD1*, BLM*, BMPR1A*, BRCA1*, BRCA2*, BRIP1*, CDC73*, CDH1*, CDK4, CDKN1B*, CDKN2A, CHEK2*, CTNNA1*, DICER1*, EPCAM (del/dup only), EGFR, FH*, FLCN*, GREM1 (promoter dup only), HOXB13, KIT, LZTR1, MAX*, MBD4, MEN1*, MET, MITF, MLH1*, MSH2*, MSH3*, MSH6*, MUTYH*, NF1*, NF2*, NTHL1*, PALB2*, PDGFRA,  PMS2*, POLD1*, POLE*, POT1*, PRKAR1A*, PTCH1*, PTEN*, RAD51C*, RAD51D*, RB1*, RET, SDHA* (sequencing only), SDHAF2*, SDHB*, SDHC*, SDHD*, SMAD4*, SMARCA4*, SMARCB1*, SMARCE1*, STK11*, SUFU*, TMEM127*, TP53*, TSC1*, TSC2*, VHL*.   The test report has been scanned into EPIC and is located under the Molecular Pathology section of the Results Review tab.  A portion of the result report is included below for reference. Genetic testing reported out on March 13, 2022.      We recommended Mr. Agostinelli pursue testing for the  familial hereditary cancer gene mutation called CHEK2 c.1263del (p.Ser422Valfs*15). Mr. Loflin test did not reveal the familial mutation. We call this result a true negative result because the cancer-causing mutation was identified in Mr. Yepiz family, and he did not inherit it.  Given this negative result, Mr. Weatherly chances of developing CHEK2-related cancers are closer to that of the general population.      Of note, this result does not explain the paternal family history of cancer. Even though a pathogenic variant was not identified, possible explanations for the cancer in the paternal family may include: There may be no hereditary risk for cancer in the paternal family. The cancers in Ms. Mr. Grainger paternal family may be sporadic/familial or due to other genetic and environmental factors. There may be a gene mutation in one of these genes that current testing methods cannot detect but that chance is small. There could be another gene that has not yet been discovered, or that we have not yet tested, that is responsible for the cancer diagnoses in the paternal family.  It is also possible there is a hereditary cause for the cancer in the paternal family that Mr. Clarin did not inherit.   Therefore, it is important to remain in touch with cancer genetics in the future so that we can continue to offer Mr. Clemon the most up to date genetic testing.      ADDITIONAL GENETIC TESTING:  We discussed with Mr. Vigen that his genetic testing was fairly extensive.  If there are additional relevant genes identified to increase cancer risk that can be analyzed in the future, we would be happy to discuss and coordinate this testing at that time.     CANCER SCREENING RECOMMENDATIONS:  Mr. Sires test result is considered negative (normal).  The family CHEK2 mutation was not detected in his sample.  Thus, CHEK2-related high risk screening is not indicated for Mr. Mcvay. The negative result also  means that we have not identified a hereditary cause for his paternal family history of cancer.    An individual's cancer risk and medical management are not determined by genetic test results alone. Overall cancer risk assessment incorporates additional factors, including personal medical history, family history, and any available genetic information that may result in a personalized plan for cancer prevention and surveillance. Therefore, it is recommended he continue to follow the cancer management and screening guidelines provided by his primary healthcare provider.   RECOMMENDATIONS FOR FAMILY MEMBERS:   Since he did not inherit a identifiable mutation in a cancer predisposition gene included on this panel, his children could not have inherited a known mutation from her in one of these genes. Maternal relatives should have genetic counseling/testing as appropriate based on the maternal family history of both CHEK2 and RAD51C mutations. Based on the paternal family history, we recommend his cousin, Audry Pili, who was diagnosed with prostate cancer, have genetic counseling and testing. Mr. Schroepfer can let us know if we can be of any assistance in  coordinating genetic counseling and/or testing for any family members.     FOLLOW-UP:  Lastly, we discussed with Mr. Reh that cancer genetics is a rapidly advancing field and it is possible that new genetic tests will be appropriate for him and/or his family members in the future. We encouraged him to remain in contact with cancer genetics on an annual basis so we can update his personal and family histories and let him know of advances in cancer genetics that may benefit this family.    Our contact number was provided. Mr. Dentler questions were answered to his satisfaction, and he knows he is welcome to call us at anytime with additional questions or concerns.    Ricardo Schubach M. Joette Catching, [place], [facility] Genetic Counselor [email or web address] (P) [phone]

## 2022-05-19 ENCOUNTER — Encounter: Payer: Self-pay | Admitting: Family Medicine

## 2022-05-20 ENCOUNTER — Other Ambulatory Visit: Payer: Self-pay

## 2022-05-20 DIAGNOSIS — Z79899 Other long term (current) drug therapy: Secondary | ICD-10-CM

## 2022-05-20 DIAGNOSIS — Z0000 Encounter for general adult medical examination without abnormal findings: Secondary | ICD-10-CM

## 2022-05-20 DIAGNOSIS — I10 Essential (primary) hypertension: Secondary | ICD-10-CM

## 2022-05-20 DIAGNOSIS — Z125 Encounter for screening for malignant neoplasm of prostate: Secondary | ICD-10-CM

## 2022-05-20 DIAGNOSIS — E782 Mixed hyperlipidemia: Secondary | ICD-10-CM

## 2022-05-20 NOTE — Telephone Encounter (Signed)
Nurses I recommend lipid, liver, metabolic 7, PSA, CBC Also urine ACR Diagnosis wellness, hyperlipidemia, hypertension, screening prostate cancer

## 2022-05-30 LAB — CBC WITH DIFFERENTIAL/PLATELET
Basophils Absolute: 0.1 10*3/uL (ref 0.0–0.2)
Basos: 1 %
EOS (ABSOLUTE): 0.1 10*3/uL (ref 0.0–0.4)
Eos: 1 %
Hematocrit: 48 % (ref 37.5–51.0)
Hemoglobin: 16.1 g/dL (ref 13.0–17.7)
Immature Grans (Abs): 0 10*3/uL (ref 0.0–0.1)
Immature Granulocytes: 0 %
Lymphocytes Absolute: 1.9 10*3/uL (ref 0.7–3.1)
Lymphs: 29 %
MCH: 31.5 pg (ref 26.6–33.0)
MCHC: 33.5 g/dL (ref 31.5–35.7)
MCV: 94 fL (ref 79–97)
Monocytes Absolute: 0.4 10*3/uL (ref 0.1–0.9)
Monocytes: 6 %
Neutrophils Absolute: 4.2 10*3/uL (ref 1.4–7.0)
Neutrophils: 63 %
Platelets: 254 10*3/uL (ref 150–450)
RBC: 5.11 x10E6/uL (ref 4.14–5.80)
RDW: 12.4 % (ref 11.6–15.4)
WBC: 6.6 10*3/uL (ref 3.4–10.8)

## 2022-05-30 LAB — MICROALBUMIN / CREATININE URINE RATIO
Creatinine, Urine: 88 mg/dL
Microalb/Creat Ratio: <3 mg/g creat (ref 0–29)
Microalbumin, Urine: <3 ug/mL

## 2022-05-30 LAB — BASIC METABOLIC PANEL
BUN/Creatinine Ratio: 16 (ref 10–24)
BUN: 13 mg/dL (ref 8–27)
CO2: 23 mmol/L (ref 20–29)
Calcium: 9.1 mg/dL (ref 8.6–10.2)
Chloride: 102 mmol/L (ref 96–106)
Creatinine, Ser: 0.81 mg/dL (ref 0.76–1.27)
Glucose: 101 mg/dL — ABNORMAL HIGH (ref 70–99)
Potassium: 4.4 mmol/L (ref 3.5–5.2)
Sodium: 139 mmol/L (ref 134–144)
eGFR: 99 mL/min/{1.73_m2} (ref >59)

## 2022-05-30 LAB — HEPATIC FUNCTION PANEL
ALT: 19 IU/L (ref 0–44)
AST: 21 IU/L (ref 0–40)
Albumin: 4.6 g/dL (ref 3.9–4.9)
Alkaline Phosphatase: 65 IU/L (ref 44–121)
Bilirubin Total: 0.4 mg/dL (ref 0.0–1.2)
Bilirubin, Direct: 0.1 mg/dL (ref 0.00–0.40)
Total Protein: 7 g/dL (ref 6.0–8.5)

## 2022-05-30 LAB — LIPID PANEL
Chol/HDL Ratio: 2.6 ratio (ref 0.0–5.0)
Cholesterol, Total: 139 mg/dL (ref 100–199)
HDL: 54 mg/dL (ref >39)
LDL Chol Calc (NIH): 67 mg/dL (ref 0–99)
Triglycerides: 96 mg/dL (ref 0–149)
VLDL Cholesterol Cal: 18 mg/dL (ref 5–40)

## 2022-05-30 LAB — PSA: Prostate Specific Ag, Serum: 1 ng/mL (ref 0.0–4.0)

## 2022-06-03 ENCOUNTER — Encounter: Payer: BC Managed Care – PPO | Admitting: Family Medicine

## 2022-06-03 ENCOUNTER — Ambulatory Visit (INDEPENDENT_AMBULATORY_CARE_PROVIDER_SITE_OTHER): Payer: BC Managed Care – PPO | Admitting: Family Medicine

## 2022-06-03 VITALS — BP 128/78 | HR 63 | Temp 98.4°F | Ht 68.75 in | Wt 204.0 lb

## 2022-06-03 DIAGNOSIS — Z0001 Encounter for general adult medical examination with abnormal findings: Secondary | ICD-10-CM | POA: Diagnosis not present

## 2022-06-03 DIAGNOSIS — I10 Essential (primary) hypertension: Secondary | ICD-10-CM

## 2022-06-03 DIAGNOSIS — Z0000 Encounter for general adult medical examination without abnormal findings: Secondary | ICD-10-CM

## 2022-06-03 DIAGNOSIS — E782 Mixed hyperlipidemia: Secondary | ICD-10-CM | POA: Diagnosis not present

## 2022-06-03 MED ORDER — ROSUVASTATIN CALCIUM 5 MG PO TABS: 5.0000 mg | ORAL_TABLET | Freq: Every day | ORAL | 1 refills | Status: DC

## 2022-06-03 MED ORDER — AMLODIPINE BESYLATE 5 MG PO TABS: 5.0000 mg | ORAL_TABLET | Freq: Every day | ORAL | 1 refills | Status: DC

## 2022-06-03 NOTE — Progress Notes (Signed)
   Subjective:    Patient ID: FENRIS CAUBLE, male    DOB: [birth year], 62 y.o.   MRN: [id number]  HPI The patient comes in today for a wellness visit.    A review of their health history was completed.  A review of medications was also completed.  Any needed refills; yes  Eating habits: good  Falls/  MVA accidents in past few months: no  Regular exercise: yes  Specialist pt sees on regular basis: no  Preventative health issues were discussed.   Additional concerns: ears ringing     Review of Systems     Objective:   Physical Exam General-in no acute distress Eyes-no discharge Lungs-respiratory rate normal, CTA CV-no murmurs,RRR Extremities skin warm dry no edema Neuro grossly normal Behavior normal, alert  Prostate exam normal      Assessment & Plan:  1. Well adult exam Adult wellness-complete.wellness physical was conducted today. Importance of diet and exercise were discussed in detail.  Importance of stress reduction and healthy living were discussed.  In addition to this a discussion regarding safety was also covered.  We also reviewed over immunizations and gave recommendations regarding current immunization needed for age.   In addition to this additional areas were also touched on including: Preventative health exams needed:  Colonoscopy August of this coming year patient is aware  Patient was advised yearly wellness exam   2. Essential hypertension, benign HTN- patient seen for follow-up regarding HTN.   Diet, medication compliance, appropriate labs and refills were completed.   Importance of keeping blood pressure under good control to lessen the risk of complications discussed Regular follow-up visits discussed   3. Mixed hyperlipidemia Hyperlipidemia-importance of diet, weight control, activity, compliance with medications discussed.   Recent labs reviewed.   Any additional labs or refills ordered.   Importance of keeping under good  control discussed. Regular follow-up visits discussed  Patient is working on diet and exercise to try to bring his weight down

## 2022-06-04 ENCOUNTER — Encounter: Payer: Self-pay | Admitting: Family Medicine

## 2022-06-06 ENCOUNTER — Encounter: Payer: Self-pay | Admitting: Family Medicine

## 2022-06-06 NOTE — Telephone Encounter (Signed)
Notified patient via Mychart got message and will keep look out for eye exam.

## 2022-06-28 ENCOUNTER — Telehealth: Payer: Self-pay | Admitting: Internal Medicine

## 2022-06-28 NOTE — Telephone Encounter (Signed)
Patient called and asked that we forward his records to Dr. Rafael Bihari with [facility] office.  Fax# to that office is [phone]

## 2022-07-01 ENCOUNTER — Telehealth: Payer: Self-pay | Admitting: Gastroenterology

## 2022-07-01 NOTE — Telephone Encounter (Signed)
Good afternoon Dr. Bryan Lemma,   We received a call from this patient, he is requesting to transfer his care over to you from Dr. Gala Romney at [facility]. His wife is currently your patient and he states you have done outstanding with her and is amazed with the care you have given her. Patient last had colonoscopy in 11/2017 and is due for one in August. Patient records are in [place] for your review. Please advise on scheduling.   Thank you.

## 2022-07-01 NOTE — Telephone Encounter (Signed)
Records reviewed.  Last colonoscopy was 12/17/2017 by Dr. Gala Romney and notable for 2 subcentimeter polyps in the descending/ascending colon (path: Both adenomas), Left-sided diverticulosis, and recommended repeat colonoscopy in 5 years for ongoing surveillance.  His wife is also under my care and he asked to transfer his GI care to me during her last appointment.  I am perfectly fine with that transfer.  In the absence of any active GI symptoms, ok to schedule direct access colonoscopy with me in 11/2022 for ongoing surveillance.

## 2022-07-02 NOTE — Telephone Encounter (Signed)
Left voicemail for patient to call back, recall placed for August 2024.

## 2022-08-21 ENCOUNTER — Encounter: Payer: Self-pay | Admitting: Gastroenterology

## 2022-11-11 ENCOUNTER — Encounter: Payer: Self-pay | Admitting: *Deleted

## 2022-11-12 ENCOUNTER — Ambulatory Visit (AMBULATORY_SURGERY_CENTER): Payer: BC Managed Care – PPO

## 2022-11-12 ENCOUNTER — Encounter: Payer: Self-pay | Admitting: Gastroenterology

## 2022-11-12 VITALS — Ht 68.75 in | Wt 193.0 lb

## 2022-11-12 DIAGNOSIS — Z86010 Personal history of colonic polyps: Secondary | ICD-10-CM

## 2022-11-12 MED ORDER — NA SULFATE-K SULFATE-MG SULF 17.5-3.13-1.6 GM/177ML PO SOLN: 1.0000 | Freq: Once | ORAL | 0 refills | Status: AC

## 2022-11-12 MED ORDER — NA SULFATE-K SULFATE-MG SULF 17.5-3.13-1.6 GM/177ML PO SOLN: 1.0000 | Freq: Once | ORAL | 0 refills | Status: DC

## 2022-11-12 NOTE — Progress Notes (Signed)

## 2022-12-02 ENCOUNTER — Encounter: Payer: Self-pay | Admitting: Family Medicine

## 2022-12-02 ENCOUNTER — Ambulatory Visit: Payer: BC Managed Care – PPO | Admitting: Family Medicine

## 2022-12-02 VITALS — BP 134/78 | HR 64 | Temp 97.2°F | Ht 68.75 in | Wt 193.0 lb

## 2022-12-02 DIAGNOSIS — R1030 Lower abdominal pain, unspecified: Secondary | ICD-10-CM | POA: Diagnosis not present

## 2022-12-02 DIAGNOSIS — K429 Umbilical hernia without obstruction or gangrene: Secondary | ICD-10-CM | POA: Diagnosis not present

## 2022-12-02 DIAGNOSIS — I10 Essential (primary) hypertension: Secondary | ICD-10-CM | POA: Diagnosis not present

## 2022-12-02 DIAGNOSIS — E782 Mixed hyperlipidemia: Secondary | ICD-10-CM | POA: Diagnosis not present

## 2022-12-02 DIAGNOSIS — Z79899 Other long term (current) drug therapy: Secondary | ICD-10-CM

## 2022-12-02 MED ORDER — TRIAMCINOLONE ACETONIDE 0.1 % EX CREA: 1.0000 | TOPICAL_CREAM | Freq: Two times a day (BID) | CUTANEOUS | 4 refills | Status: AC

## 2022-12-02 MED ORDER — AMLODIPINE BESYLATE 5 MG PO TABS: 5.0000 mg | ORAL_TABLET | Freq: Every day | ORAL | 1 refills | Status: DC

## 2022-12-02 MED ORDER — ROSUVASTATIN CALCIUM 5 MG PO TABS: 5.0000 mg | ORAL_TABLET | Freq: Every day | ORAL | 1 refills | Status: DC

## 2022-12-02 NOTE — Progress Notes (Signed)
   Subjective:    Patient ID: Raymond Rosales, male    DOB: 1960/05/26, 62 y.o.   MRN: [id number]  Hypertension This is a chronic problem. Treatments tried: amlodipine.   Follow up for umbilical hernia - referall  Intermittent low abd discomfort for last couple of weeks   Patient for blood pressure check up.  The patient does have hypertension.   Patient relates dietary measures try to minimize salt The importance of healthy diet and activity were discussed Patient relates compliance  Patient here for follow-up regarding cholesterol.    Patient relates taking medication on a regular basis Denies problems with medication Importance of dietary measures discussed Regular lab work regarding lipid and liver was checked and if needing additional labs was appropriately ordered  Review of Systems     Objective:   Physical Exam General-in no acute distress Eyes-no discharge Lungs-respiratory rate normal, CTA CV-no murmurs,RRR Extremities skin warm dry no edema Neuro grossly normal Behavior normal, alert  Small umbilical hernia noted Groin no hernia        Assessment & Plan:  1. Essential hypertension, benign Blood pressure good control continue current measures - Lipid Panel - ALT  2. Mixed hyperlipidemia Hyperlipidemia continue statin check lab - Lipid Panel  3. Lower abdominal pain Intermittent sharp pains that only lasts less than 30 seconds and lower abdomen more than the area where the abdominal muscles attached to the upper pelvis no need to do any type of scans at this point we will share this with his gastroenterologist who he is going to be doing colonoscopy with coming up I believe more likely is musculoskeletal but obviously if he becomes more prominent or persistent we would need to do further workup  4. Umbilical hernia without obstruction and without gangrene Currently patient is decided to just watch this if he decides to have it looked at we will get him  set up with a general surgeon

## 2022-12-04 ENCOUNTER — Encounter: Payer: BC Managed Care – PPO | Admitting: Gastroenterology

## 2022-12-10 ENCOUNTER — Encounter: Payer: Self-pay | Admitting: Gastroenterology

## 2022-12-10 ENCOUNTER — Ambulatory Visit (AMBULATORY_SURGERY_CENTER): Payer: BC Managed Care – PPO | Admitting: Gastroenterology

## 2022-12-10 VITALS — BP 134/81 | HR 58 | Temp 97.3°F | Resp 14 | Ht 68.0 in | Wt 194.2 lb

## 2022-12-10 DIAGNOSIS — D120 Benign neoplasm of cecum: Secondary | ICD-10-CM

## 2022-12-10 DIAGNOSIS — D122 Benign neoplasm of ascending colon: Secondary | ICD-10-CM

## 2022-12-10 DIAGNOSIS — Z09 Encounter for follow-up examination after completed treatment for conditions other than malignant neoplasm: Secondary | ICD-10-CM

## 2022-12-10 DIAGNOSIS — D125 Benign neoplasm of sigmoid colon: Secondary | ICD-10-CM

## 2022-12-10 DIAGNOSIS — Z86010 Personal history of colonic polyps: Secondary | ICD-10-CM | POA: Diagnosis not present

## 2022-12-10 DIAGNOSIS — D124 Benign neoplasm of descending colon: Secondary | ICD-10-CM

## 2022-12-10 DIAGNOSIS — K5730 Diverticulosis of large intestine without perforation or abscess without bleeding: Secondary | ICD-10-CM

## 2022-12-10 DIAGNOSIS — K640 First degree hemorrhoids: Secondary | ICD-10-CM

## 2022-12-10 DIAGNOSIS — K635 Polyp of colon: Secondary | ICD-10-CM

## 2022-12-10 MED ORDER — SODIUM CHLORIDE 0.9 % IV SOLN: 500.0000 mL | Freq: Once | INTRAVENOUS | Status: AC

## 2022-12-10 NOTE — Progress Notes (Signed)
Patient reluctant to pass gas.  Wife laughing.  States #4 pain. Levsin given.  Patient still states that he is bloated.  Turned from side to side at this time.  [address] states that the pain is a#2, but still bloated.  Explained that he will be bloated most of today.

## 2022-12-10 NOTE — Progress Notes (Signed)
Called to room to assist during endoscopic procedure.  Patient ID and intended procedure confirmed with present staff. Received instructions for my participation in the procedure from the performing physician.  

## 2022-12-10 NOTE — Progress Notes (Signed)
Vss nad trans to pacu 

## 2022-12-10 NOTE — Progress Notes (Signed)
GASTROENTEROLOGY PROCEDURE H&P NOTE   Primary Care Physician: Babs Sciara, MD    Reason for Procedure:  Colon Cancer screening, colon polyp surveillance  Plan:    Colonoscopy  Patient is appropriate for endoscopic procedure(s) in the ambulatory ([facility]) setting.  The nature of the procedure, as well as the risks, benefits, and alternatives were carefully and thoroughly reviewed with the patient. Ample time for discussion and questions allowed. The patient understood, was satisfied, and agreed to proceed.     HPI: Raymond Rosales is a 62 y.o. male who presents for colonoscopy for ongoing colon polyp surveillance.  No active GI symptoms.    Last colonoscopy was 12/17/2017 by Dr. Jena Gauss and notable for 2 subcentimeter polyps in the descending/ascending colon (path: Both adenomas), Left-sided diverticulosis, and recommended repeat colonoscopy in 5 years for ongoing surveillance.   Past Medical History:  Diagnosis Date   Family history of CHEK2 gene mutation 03/11/2022   History of colon polyps 2011   Hyperlipidemia 11/29/2019   Hypertension    Reflux    Seasonal allergies     Past Surgical History:  Procedure Laterality Date   COLONOSCOPY N/A 09/22/2012   Procedure: COLONOSCOPY;  Surgeon: Corbin Ade, MD;  Location: [facility];  Service: Endoscopy;  Laterality: N/A;  7:30 AM   COLONOSCOPY N/A 12/17/2017   Procedure: COLONOSCOPY;  Surgeon: Corbin Ade, MD;  Location: [facility];  Service: Endoscopy;  Laterality: N/A;  9:30   POLYPECTOMY  12/17/2017   Procedure: POLYPECTOMY;  Surgeon: Corbin Ade, MD;  Location: [facility];  Service: Endoscopy;;  ascending and descending   TONSILLECTOMY     17  yrs of age    Prior to Admission medications   Medication Sig Start Date End Date Taking? Authorizing Provider  amLODipine (NORVASC) 5 MG tablet Take 1 tablet (5 mg total) by mouth daily. 12/02/22  Yes Babs Sciara, MD  Multiple Vitamin (MULTIVITAMIN) tablet Take  1 tablet by mouth daily.   Yes [provider]  Polyethyl Glycol-Propyl Glycol (SYSTANE OP) Place 1 drop into both eyes daily as needed (dry eyes).   Yes [provider]  rosuvastatin (CRESTOR) 5 MG tablet Take 1 tablet (5 mg total) by mouth daily. 12/02/22  Yes Babs Sciara, MD  triamcinolone cream (KENALOG) 0.1 % Apply 1 Application topically 2 (two) times daily. 12/02/22  Yes Babs Sciara, MD    Current Outpatient Medications  Medication Sig Dispense Refill   amLODipine (NORVASC) 5 MG tablet Take 1 tablet (5 mg total) by mouth daily. 90 tablet 1   Multiple Vitamin (MULTIVITAMIN) tablet Take 1 tablet by mouth daily.     Polyethyl Glycol-Propyl Glycol (SYSTANE OP) Place 1 drop into both eyes daily as needed (dry eyes).     rosuvastatin (CRESTOR) 5 MG tablet Take 1 tablet (5 mg total) by mouth daily. 90 tablet 1   triamcinolone cream (KENALOG) 0.1 % Apply 1 Application topically 2 (two) times daily. 45 g 4   Current Facility-Administered Medications  Medication Dose Route Frequency Provider Last Rate Last Admin   0.9 %  sodium chloride infusion  500 mL Intravenous Once Omega Durante V, DO        Allergies as of 12/10/2022 - Review Complete 12/10/2022  Allergen Reaction Noted   Penicillins Rash 09/02/2012   Coq10 [coenzyme q10]  11/28/2020    Family History  Problem Relation Age of Onset   Breast cancer Mother  dx 80 and dx 85 (two primaries); CHEK2 mutation   CAD Father    Heart attack Father    Diabetes type II Father    Lung cancer Father    Ovarian cancer Maternal Aunt        dx 49s   Bladder Cancer Paternal Aunt        father's paternal half sister   Cancer Paternal Aunt        unknown type; father's maternal half sister   Prostate cancer Paternal Uncle        father's paternal half sister   Bladder Cancer Paternal Uncle        father's paternal half sister   Cancer Cousin        paternal cousins w/ prostate, throat, lung cancer   Lymphoma  Other        MGM's sister   Cancer Other        PGF's siblings w/ lung, breast, stomach cancers   Colon cancer Neg Hx    Colon polyps Neg Hx    Esophageal cancer Neg Hx    Rectal cancer Neg Hx    Stomach cancer Neg Hx     Social History   Socioeconomic History   Marital status: Married    Spouse name: Not on file   Number of children: Not on file   Years of education: Not on file   Highest education level: Not on file  Occupational History   Not on file  Tobacco Use   Smoking status: Never   Smokeless tobacco: Never  Vaping Use   Vaping status: Never Used  Substance and Sexual Activity   Alcohol use: Yes    Comment: occasional social   Drug use: No   Sexual activity: Yes  Other Topics Concern   Not on file  Social History Narrative   Not on file   Social Determinants of Health   Financial Resource Strain: Not on file  Food Insecurity: Not on file  Transportation Needs: Not on file  Physical Activity: Not on file  Stress: Not on file  Social Connections: Not on file  Intimate Partner Violence: Not on file    Physical Exam: Vital signs in last 24 hours: @BP  139/76   Pulse 62   Temp (!) 97.3 F (36.3 C)   Ht 5\' 8"  (1.727 m)   Wt 194 lb 3.2 oz (88.1 kg)   SpO2 100%   BMI 29.53 kg/m  GEN: NAD EYE: Sclerae anicteric ENT: MMM CV: Non-tachycardic Pulm: CTA b/l GI: Soft, NT/ND NEURO:  Alert & Oriented x 3   Doristine Locks, DO [facility]   12/10/2022 10:32 AM

## 2022-12-10 NOTE — Op Note (Signed)
[facility] Patient Name: Raymond Rosales Procedure Date: 12/10/2022 10:29 AM MRN: [id number] Endoscopist: Doristine Locks , MD, [id number] Age: 62 Referring MD:  Date of Birth: 1961/02/14 Gender: Male Account #: [id number] Procedure:                Colonoscopy Indications:              Surveillance: Personal history of adenomatous                            polyps on last colonoscopy 5 years ago                           Last colonoscopy was 12/17/2017 by Dr. Jena Gauss and                            notable for 2 subcentimeter polyps in the                            descending/ascending colon (path: Both adenomas),                            Left-sided diverticulosis, and recommended repeat                            colonoscopy in 5 years for ongoing surveillance. Medicines:                Monitored Anesthesia Care Procedure:                Pre-Anesthesia Assessment:                           - Prior to the procedure, a History and Physical                            was performed, and patient medications and                            allergies were reviewed. The patient's tolerance of                            previous anesthesia was also reviewed. The risks                            and benefits of the procedure and the sedation                            options and risks were discussed with the patient.                            All questions were answered, and informed consent                            was obtained. Prior Anticoagulants: The patient has  taken no anticoagulant or antiplatelet agents. ASA                            Grade Assessment: II - A patient with mild systemic                            disease. After reviewing the risks and benefits,                            the patient was deemed in satisfactory condition to                            undergo the procedure.                           After obtaining informed consent,  the colonoscope                            was passed under direct vision. Throughout the                            procedure, the patient's blood pressure, pulse, and                            oxygen saturations were monitored continuously. The                            Olympus Scope SN: [id number] was introduced through                            the anus and advanced to the the cecum, identified                            by appendiceal orifice and ileocecal valve. The                            colonoscopy was performed without difficulty. The                            patient tolerated the procedure well. The quality                            of the bowel preparation was good. The ileocecal                            valve, appendiceal orifice, and rectum were                            photographed. Scope In: 10:43:22 AM Scope Out: 11:02:38 AM Scope Withdrawal Time: 0 hours 15 minutes 41 seconds  Total Procedure Duration: 0 hours 19 minutes 16 seconds  Findings:                 The perianal and digital rectal examinations were  normal.                           An 8 mm polyp was found in the cecum. The polyp was                            sessile. The polyp was removed with a cold snare.                            Resection and retrieval were complete. Estimated                            blood loss was minimal.                           Three sessile polyps were found in the descending                            colon (1) and ascending colon (2). The polyps were                            2 to 4 mm in size. These polyps were removed with a                            cold snare. Resection and retrieval were complete.                            Estimated blood loss was minimal.                           A 3 mm polyp was found in the sigmoid colon. The                            polyp was sessile. The polyp was removed with a                             cold snare. Resection and retrieval were complete.                            Estimated blood loss was minimal.                           Multiple medium-mouthed and small-mouthed                            diverticula were found in the sigmoid colon and                            descending colon.                           Non-bleeding internal hemorrhoids were found during  retroflexion. The hemorrhoids were small.                           Retroflexion in the right colon was performed. Complications:            No immediate complications. Estimated Blood Loss:     Estimated blood loss was minimal. Impression:               - One 8 mm polyp in the cecum, removed with a cold                            snare. Resected and retrieved.                           - Three 2 to 4 mm polyps in the descending colon                            and in the ascending colon, removed with a cold                            snare. Resected and retrieved.                           - One 3 mm polyp in the sigmoid colon, removed with                            a cold snare. Resected and retrieved.                           - Diverticulosis in the sigmoid colon and in the                            descending colon.                           - Non-bleeding internal hemorrhoids. Recommendation:           - Patient has a contact number available for                            emergencies. The signs and symptoms of potential                            delayed complications were discussed with the                            patient. Return to normal activities tomorrow.                            Written discharge instructions were provided to the                            patient.                           - Resume  previous diet.                           - Continue present medications.                           - Await pathology results.                           - Repeat colonoscopy  for surveillance based on                            pathology results.                           - Return to GI office PRN. Doristine Locks, MD 12/10/2022 11:08:55 AM

## 2022-12-10 NOTE — Patient Instructions (Signed)
Resume all of your previous medications today as ordered.  Read all of the handouts given to you by your recovery room nurse.  YOU HAD AN ENDOSCOPIC PROCEDURE TODAY AT THE [facility]:   Refer to the procedure report that was given to you for any specific questions about what was found during the examination.  If the procedure report does not answer your questions, please call your gastroenterologist to clarify.  If you requested that your care partner not be given the details of your procedure findings, then the procedure report has been included in a sealed envelope for you to review at your convenience later.  YOU SHOULD EXPECT: Some feelings of bloating in the abdomen. Passage of more gas than usual.  Walking can help get rid of the air that was put into your GI tract during the procedure and reduce the bloating. If you had a lower endoscopy (such as a colonoscopy or flexible sigmoidoscopy) you may notice spotting of blood in your stool or on the toilet paper. If you underwent a bowel prep for your procedure, you may not have a normal bowel movement for a few days.  Please Note:  You might notice some irritation and congestion in your nose or some drainage.  This is from the oxygen used during your procedure.  There is no need for concern and it should clear up in a day or so.  SYMPTOMS TO REPORT IMMEDIATELY:  Following lower endoscopy (colonoscopy or flexible sigmoidoscopy):  Excessive amounts of blood in the stool  Significant tenderness or worsening of abdominal pains  Swelling of the abdomen that is new, acute  Fever of 100F or higher   For urgent or emergent issues, a gastroenterologist can be reached at any hour by calling [phone]. Do not use MyChart messaging for urgent concerns.    DIET:  We do recommend a small meal at first, but then you may proceed to your regular diet.  Drink plenty of fluids but you should avoid alcoholic beverages for 24 hours. Try to  increase the fiber in your diet,and drink plenty of water.  ACTIVITY:  You should plan to take it easy for the rest of today and you should NOT DRIVE or use heavy machinery until tomorrow (because of the sedation medicines used during the test).    FOLLOW UP: Our staff will call the number listed on your records the next business day following your procedure.  We will call around 7:15- 8:00 am to check on you and address any questions or concerns that you may have regarding the information given to you following your procedure. If we do not reach you, we will leave a message.     If any biopsies were taken you will be contacted by phone or by letter within the next 1-3 weeks.  Please call us at [phone] if you have not heard about the biopsies in 3 weeks.    SIGNATURES/CONFIDENTIALITY: You and/or your care partner have signed paperwork which will be entered into your electronic medical record.  These signatures attest to the fact that that the information above on your After Visit Summary has been reviewed and is understood.  Full responsibility of the confidentiality of this discharge information lies with you and/or your care-partner.

## 2022-12-11 ENCOUNTER — Telehealth: Payer: Self-pay

## 2022-12-11 NOTE — Telephone Encounter (Signed)
  Follow up Call-     12/10/2022   10:00 AM  Call back number  Post procedure Call Back phone  # [phone]  Permission to leave phone message Yes     Patient questions:  Do you have a fever, pain , or abdominal swelling? No. Pain Score  0 *  Have you tolerated food without any problems? Yes.    Have you been able to return to your normal activities? Yes.    Do you have any questions about your discharge instructions: Diet   No. Medications  No. Follow up visit  No.  Do you have questions or concerns about your Care? No.  Actions: * If pain score is 4 or above: No action needed, pain <4.

## 2023-05-01 ENCOUNTER — Telehealth: Payer: Self-pay | Admitting: Family Medicine

## 2023-05-01 NOTE — Telephone Encounter (Signed)
 Lipid, liver, metabolic 7, urine micro protein (urine ACR), PSA Hypertension, hyperlipidemia, wellness, prostate cancer screening

## 2023-05-01 NOTE — Telephone Encounter (Signed)
 Patient has appointment on 2/6 for physical  and needing labs

## 2023-05-02 ENCOUNTER — Other Ambulatory Visit: Payer: Self-pay

## 2023-05-02 DIAGNOSIS — Z79899 Other long term (current) drug therapy: Secondary | ICD-10-CM

## 2023-05-02 DIAGNOSIS — Z125 Encounter for screening for malignant neoplasm of prostate: Secondary | ICD-10-CM

## 2023-05-02 DIAGNOSIS — I10 Essential (primary) hypertension: Secondary | ICD-10-CM

## 2023-05-02 DIAGNOSIS — E782 Mixed hyperlipidemia: Secondary | ICD-10-CM

## 2023-05-02 NOTE — Telephone Encounter (Signed)
Informed pt labs have been ordered.

## 2023-05-28 ENCOUNTER — Encounter: Payer: Self-pay | Admitting: Family Medicine

## 2023-05-28 LAB — LIPID PANEL
Chol/HDL Ratio: 2.7 {ratio} (ref 0.0–5.0)
Cholesterol, Total: 150 mg/dL (ref 100–199)
HDL: 55 mg/dL (ref >39)
LDL Chol Calc (NIH): 77 mg/dL (ref 0–99)
Triglycerides: 95 mg/dL (ref 0–149)
VLDL Cholesterol Cal: 18 mg/dL (ref 5–40)

## 2023-05-28 LAB — HEPATIC FUNCTION PANEL
ALT: 20 [IU]/L (ref 0–44)
AST: 25 [IU]/L (ref 0–40)
Albumin: 4.4 g/dL (ref 3.9–4.9)
Alkaline Phosphatase: 65 [IU]/L (ref 44–121)
Bilirubin Total: 0.6 mg/dL (ref 0.0–1.2)
Bilirubin, Direct: 0.18 mg/dL (ref 0.00–0.40)
Total Protein: 6.8 g/dL (ref 6.0–8.5)

## 2023-05-28 LAB — MICROALBUMIN / CREATININE URINE RATIO
Creatinine, Urine: 137.2 mg/dL
Microalb/Creat Ratio: 5 mg/g{creat} (ref 0–29)
Microalbumin, Urine: 6.7 ug/mL

## 2023-05-28 LAB — BASIC METABOLIC PANEL
BUN/Creatinine Ratio: 18 (ref 10–24)
BUN: 15 mg/dL (ref 8–27)
CO2: 23 mmol/L (ref 20–29)
Calcium: 9 mg/dL (ref 8.6–10.2)
Chloride: 106 mmol/L (ref 96–106)
Creatinine, Ser: 0.85 mg/dL (ref 0.76–1.27)
Glucose: 95 mg/dL (ref 70–99)
Potassium: 4.8 mmol/L (ref 3.5–5.2)
Sodium: 144 mmol/L (ref 134–144)
eGFR: 98 mL/min/{1.73_m2} (ref >59)

## 2023-05-28 LAB — PSA: Prostate Specific Ag, Serum: 1.1 ng/mL (ref 0.0–4.0)

## 2023-06-05 ENCOUNTER — Encounter: Payer: Self-pay | Admitting: Family Medicine

## 2023-06-05 ENCOUNTER — Ambulatory Visit: Payer: 59 | Admitting: Family Medicine

## 2023-06-05 VITALS — BP 138/82 | HR 78 | Temp 98.2°F | Ht 68.0 in | Wt 196.7 lb

## 2023-06-05 DIAGNOSIS — Z0001 Encounter for general adult medical examination with abnormal findings: Secondary | ICD-10-CM | POA: Diagnosis not present

## 2023-06-05 DIAGNOSIS — I872 Venous insufficiency (chronic) (peripheral): Secondary | ICD-10-CM

## 2023-06-05 DIAGNOSIS — E782 Mixed hyperlipidemia: Secondary | ICD-10-CM

## 2023-06-05 DIAGNOSIS — Z0000 Encounter for general adult medical examination without abnormal findings: Secondary | ICD-10-CM

## 2023-06-05 DIAGNOSIS — I10 Essential (primary) hypertension: Secondary | ICD-10-CM | POA: Diagnosis not present

## 2023-06-05 MED ORDER — AMLODIPINE BESYLATE 5 MG PO TABS: 5.0000 mg | ORAL_TABLET | Freq: Every day | ORAL | 1 refills | Status: DC

## 2023-06-05 MED ORDER — ROSUVASTATIN CALCIUM 5 MG PO TABS: 5.0000 mg | ORAL_TABLET | Freq: Every day | ORAL | 1 refills | Status: DC

## 2023-06-05 NOTE — Progress Notes (Signed)
   Subjective:    Patient ID: Raymond Rosales, male    DOB: [birth year], 63 y.o.   MRN: [id number]  HPI The patient comes in today for a wellness visit.    A review of their health history was completed.  A review of medications was also completed.  Any needed refills; yes  Eating habits: Recently has not done as well with eating habits but plans to get back into doing better  Falls/  MVA accidents in past few months: No accidents or injuries  Regular exercise: Plans to get back into walking on a regular basis  Specialist pt sees on regular basis: None  Preventative health issues were discussed.   Additional concerns: None    Review of Systems     Objective:   Physical Exam  General-in no acute distress Eyes-no discharge Lungs-respiratory rate normal, CTA CV-no murmurs,RRR Extremities skin warm dry no edema Neuro grossly normal Behavior normal, alert  Prostate exam normal Venous stasis dermatitis lower legs      Assessment & Plan:   1. Well adult exam (Primary) Adult wellness-complete.wellness physical was conducted today. Importance of diet and exercise were discussed in detail.  Importance of stress reduction and healthy living were discussed.  In addition to this a discussion regarding safety was also covered.  We also reviewed over immunizations and gave recommendations regarding current immunization needed for age.   In addition to this additional areas were also touched on including: Preventative health exams needed:  Colonoscopy 2029  Patient was advised yearly wellness exam   2. Essential hypertension, benign HTN- patient seen for follow-up regarding HTN.   Diet, medication compliance, appropriate labs and refills were completed.   Importance of keeping blood pressure under good control to lessen the risk of complications discussed Regular follow-up visits discussed   3. Mixed hyperlipidemia Hyperlipidemia-importance of diet, weight  control, activity, compliance with medications discussed.   Recent labs reviewed.   Any additional labs or refills ordered.   Importance of keeping under good control discussed. Regular follow-up visits discussed   4. Venous stasis dermatitis Minimal changes no sign of swelling in the legs

## 2023-06-09 LAB — OPHTHALMOLOGY REPORT-SCANNED

## 2023-12-03 ENCOUNTER — Ambulatory Visit: Payer: 59 | Admitting: Family Medicine

## 2023-12-03 VITALS — BP 138/80 | HR 69 | Temp 97.0°F | Ht 68.0 in | Wt 197.0 lb

## 2023-12-03 DIAGNOSIS — I872 Venous insufficiency (chronic) (peripheral): Secondary | ICD-10-CM | POA: Diagnosis not present

## 2023-12-03 DIAGNOSIS — E782 Mixed hyperlipidemia: Secondary | ICD-10-CM | POA: Diagnosis not present

## 2023-12-03 DIAGNOSIS — I10 Essential (primary) hypertension: Secondary | ICD-10-CM

## 2023-12-03 MED ORDER — AMLODIPINE BESYLATE 5 MG PO TABS: 5.0000 mg | ORAL_TABLET | Freq: Every day | ORAL | 1 refills | Status: DC

## 2023-12-03 MED ORDER — ROSUVASTATIN CALCIUM 5 MG PO TABS: 5.0000 mg | ORAL_TABLET | Freq: Every day | ORAL | 1 refills | Status: DC

## 2023-12-03 NOTE — Progress Notes (Signed)
   Subjective:    Patient ID: Raymond Rosales, male    DOB: [birth year], 63 y.o.   MRN: [id number]  HPI  6 month follow up for HTN< hyperlipidemia Wart on right foot toe - derm appt next week   Discussed the use of AI scribe software for clinical note transcription with the patient, who gave verbal consent to proceed.  History of Present Illness   Raymond Rosales is a 63 year old male who presents with episodes of dizziness.  He experiences brief episodes of dizziness lasting three to five seconds, primarily occurring while sitting. Increasing water  intake seems to alleviate the dizziness. He feels slightly off in terms of coordination at times but does not report any falls associated with the dizziness. No headaches, numbness, tingling, weakness, or difficulty speaking.  He maintains an active lifestyle, including walking and jet skiing, without noticing dizziness during these activities. He is conscious of his hydration, drinking a 15-ounce glass of water  first thing in the morning, which he feels helps him start his day.  He is mindful of his diet, particularly salt intake, and mentions eating nuts and peanut butter, sometimes with lightly salted options.  He has a plantar wart on his toe, which causes discomfort when wearing certain shoes. He has an appointment with a dermatologist for treatment.      Review of Systems     Objective:   Physical Exam  General-in no acute distress Eyes-no discharge Lungs-respiratory rate normal, CTA CV-no murmurs,RRR Extremities skin warm dry no edema Neuro grossly normal Behavior normal, alert Wart is noted on the foot Changes in the skin noted consistent with venous stasis dermatitis not a worry      Assessment & Plan:  1. Essential hypertension, benign (Primary) I would like to see the blood pressure little bit better control currently systolic a little higher than what I would like it to be it is within an acceptable range per  guidelines but ideal would be 130s systolic Patient will check blood pressure periodically and send us  an update in several weeks He will fit in walking on a regular basis He will minimize salt in his diet   2. Mixed hyperlipidemia LDL doing well continue current medication healthy diet recommended lab work before next visit in February Patient did raise the question of a coronary calcium  scan but patient not having any coronary symptoms and he is already being treated for cholesterol and blood pressure so therefore a coronary calcium  scan would really not be consequential he was told that if he starts having any coronary symptoms to immediately follow-up or seek care ER etc.  3. Venous stasis dermatitis Healthy diet regular physical activity  Wart-see dermatology as planned  Wellness in 6 months

## 2023-12-30 ENCOUNTER — Encounter: Payer: Self-pay | Admitting: Family Medicine

## 2024-01-02 ENCOUNTER — Other Ambulatory Visit: Payer: Self-pay | Admitting: Family Medicine

## 2024-01-02 ENCOUNTER — Encounter: Payer: Self-pay | Admitting: Family Medicine

## 2024-01-02 MED ORDER — AMLODIPINE BESYLATE 5 MG PO TABS: 5.0000 mg | ORAL_TABLET | Freq: Every day | ORAL | 0 refills | Status: DC

## 2024-02-10 ENCOUNTER — Encounter: Payer: Self-pay | Admitting: Family Medicine

## 2024-02-13 ENCOUNTER — Encounter: Payer: Self-pay | Admitting: Family Medicine

## 2024-04-04 ENCOUNTER — Encounter: Payer: Self-pay | Admitting: Family Medicine

## 2024-04-05 ENCOUNTER — Other Ambulatory Visit: Payer: Self-pay | Admitting: Family Medicine

## 2024-04-05 ENCOUNTER — Encounter: Payer: Self-pay | Admitting: Family Medicine

## 2024-04-05 DIAGNOSIS — I10 Essential (primary) hypertension: Secondary | ICD-10-CM

## 2024-04-05 DIAGNOSIS — E782 Mixed hyperlipidemia: Secondary | ICD-10-CM

## 2024-04-05 DIAGNOSIS — Z0000 Encounter for general adult medical examination without abnormal findings: Secondary | ICD-10-CM

## 2024-04-05 DIAGNOSIS — Z79899 Other long term (current) drug therapy: Secondary | ICD-10-CM

## 2024-04-05 DIAGNOSIS — Z125 Encounter for screening for malignant neoplasm of prostate: Secondary | ICD-10-CM

## 2024-04-06 ENCOUNTER — Other Ambulatory Visit: Payer: Self-pay | Admitting: Family Medicine

## 2024-04-06 MED ORDER — VALSARTAN 40 MG PO TABS: 40.0000 mg | ORAL_TABLET | Freq: Every day | ORAL | 1 refills | Status: DC

## 2024-04-11 ENCOUNTER — Encounter: Payer: Self-pay | Admitting: Family Medicine

## 2024-04-27 LAB — BASIC METABOLIC PANEL WITH GFR
BUN/Creatinine Ratio: 19 (ref 10–24)
BUN: 16 mg/dL (ref 8–27)
CO2: 24 mmol/L (ref 20–29)
Calcium: 8.9 mg/dL (ref 8.6–10.2)
Chloride: 104 mmol/L (ref 96–106)
Creatinine, Ser: 0.86 mg/dL (ref 0.76–1.27)
Glucose: 91 mg/dL (ref 70–99)
Potassium: 4.2 mmol/L (ref 3.5–5.2)
Sodium: 140 mmol/L (ref 134–144)
eGFR: 97 mL/min/1.73

## 2024-04-27 LAB — CBC WITH DIFFERENTIAL/PLATELET
Basophils Absolute: 0 x10E3/uL (ref 0.0–0.2)
Basos: 1 %
EOS (ABSOLUTE): 0.1 x10E3/uL (ref 0.0–0.4)
Eos: 1 %
Hematocrit: 44.8 % (ref 37.5–51.0)
Hemoglobin: 15.2 g/dL (ref 13.0–17.7)
Immature Grans (Abs): 0 x10E3/uL (ref 0.0–0.1)
Immature Granulocytes: 0 %
Lymphocytes Absolute: 1.7 x10E3/uL (ref 0.7–3.1)
Lymphs: 28 %
MCH: 31.9 pg (ref 26.6–33.0)
MCHC: 33.9 g/dL (ref 31.5–35.7)
MCV: 94 fL (ref 79–97)
Monocytes Absolute: 0.4 x10E3/uL (ref 0.1–0.9)
Monocytes: 7 %
Neutrophils Absolute: 3.8 x10E3/uL (ref 1.4–7.0)
Neutrophils: 63 %
Platelets: 239 x10E3/uL (ref 150–450)
RBC: 4.77 x10E6/uL (ref 4.14–5.80)
RDW: 12 % (ref 11.6–15.4)
WBC: 6.1 x10E3/uL (ref 3.4–10.8)

## 2024-04-27 LAB — MICROALBUMIN / CREATININE URINE RATIO
Creatinine, Urine: 59.2 mg/dL
Microalb/Creat Ratio: <5 mg/g{creat} (ref 0–29)
Microalbumin, Urine: <3 ug/mL

## 2024-04-27 LAB — HEPATIC FUNCTION PANEL
ALT: 25 IU/L (ref 0–44)
AST: 25 IU/L (ref 0–40)
Albumin: 4.5 g/dL (ref 3.9–4.9)
Alkaline Phosphatase: 59 IU/L (ref 47–123)
Bilirubin Total: 0.5 mg/dL (ref 0.0–1.2)
Bilirubin, Direct: 0.16 mg/dL (ref 0.00–0.40)
Total Protein: 6.8 g/dL (ref 6.0–8.5)

## 2024-04-27 LAB — LIPID PANEL
Chol/HDL Ratio: 2.7 ratio (ref 0.0–5.0)
Cholesterol, Total: 150 mg/dL (ref 100–199)
HDL: 55 mg/dL
LDL Chol Calc (NIH): 75 mg/dL (ref 0–99)
Triglycerides: 112 mg/dL (ref 0–149)
VLDL Cholesterol Cal: 20 mg/dL (ref 5–40)

## 2024-04-27 LAB — PSA: Prostate Specific Ag, Serum: 1.3 ng/mL (ref 0.0–4.0)

## 2024-04-28 ENCOUNTER — Ambulatory Visit: Payer: Self-pay | Admitting: Family Medicine

## 2024-05-01 ENCOUNTER — Telehealth: Payer: Self-pay | Admitting: Family Medicine

## 2024-05-01 ENCOUNTER — Encounter: Payer: Self-pay | Admitting: Family Medicine

## 2024-05-01 NOTE — Telephone Encounter (Signed)
 Patient's wife sent a message on her MyChart regarding the patient Transcript of this message below We will discuss this at his follow-up visit  Hey Raymond Rosales,  Here with Raymond Rosales at the eye specialist in [facility]. Having issues due to hp or diabetes with his right eye.  You know his struggles with hp and the thoughts of it. He is to see you soon and Ill be there, but I have a question and a request. I have spoken to Raymond Rosales about this so he is aware.  Due to lifestyle changes with retirement and more time on his hands one would think this request would be nonsense, but I feel he needs to be on an anxiety medicine of some sort. Im not doctor but I know my husband.  I just know the benefits of the medicine for myself and believe it could help him . Thank you . Raymond Rosales

## 2024-05-05 ENCOUNTER — Encounter: Payer: Self-pay | Admitting: Family Medicine

## 2024-05-05 ENCOUNTER — Telehealth: Payer: Self-pay | Admitting: Family Medicine

## 2024-05-05 NOTE — Telephone Encounter (Signed)
 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 

## 2024-05-19 ENCOUNTER — Ambulatory Visit: Admitting: Family Medicine

## 2024-05-19 ENCOUNTER — Encounter: Payer: Self-pay | Admitting: Family Medicine

## 2024-05-19 VITALS — BP 152/91 | HR 85 | Temp 98.4°F | Ht 68.0 in | Wt 195.3 lb

## 2024-05-19 DIAGNOSIS — I10 Essential (primary) hypertension: Secondary | ICD-10-CM

## 2024-05-19 DIAGNOSIS — Z0000 Encounter for general adult medical examination without abnormal findings: Secondary | ICD-10-CM

## 2024-05-19 DIAGNOSIS — Z0001 Encounter for general adult medical examination with abnormal findings: Secondary | ICD-10-CM | POA: Diagnosis not present

## 2024-05-19 DIAGNOSIS — E782 Mixed hyperlipidemia: Secondary | ICD-10-CM | POA: Diagnosis not present

## 2024-05-19 MED ORDER — VALSARTAN 80 MG PO TABS: 80.0000 mg | ORAL_TABLET | Freq: Every day | ORAL | 1 refills | Status: AC

## 2024-05-19 MED ORDER — AMLODIPINE BESYLATE 5 MG PO TABS: 5.0000 mg | ORAL_TABLET | Freq: Every day | ORAL | 1 refills | Status: AC

## 2024-05-19 MED ORDER — ROSUVASTATIN CALCIUM 5 MG PO TABS: 5.0000 mg | ORAL_TABLET | Freq: Every day | ORAL | 1 refills | Status: AC

## 2024-05-19 NOTE — Progress Notes (Signed)
" ° °  Subjective:    Patient ID: Raymond Rosales, male    DOB: 1961/03/03, 64 y.o.   MRN: [id number]  HPI    Review of Systems     Objective:   Physical Exam        Assessment & Plan:    "

## 2024-05-19 NOTE — Progress Notes (Signed)
 "  Subjective:    Patient ID: Raymond Rosales, male    DOB: 01/05/1961, 64 y.o.   MRN: [id number]  HPI Patient is here for a physical   Patient stated he has had cataracts in his right eye and will be getting his left eye done the 23rd  Patient states he is always worried about his blood pressure and worrying about family issues at home as well  Patient stated he has had labs done and did not have any concerns about those     05/19/2024   10:18 AM 06/05/2023   11:05 AM 12/02/2022    8:29 AM  GAD 7 : Generalized Anxiety Score  Nervous, Anxious, on Edge 1 1  0   Control/stop worrying 1 0  0   Worry too much - different things 1 1  0   Trouble relaxing 1 0  0   Restless 0 0  0   Easily annoyed or irritable 0 0  0   Afraid - awful might happen 0 0  0   Total GAD 7 Score 4 2 0  Anxiety Difficulty Somewhat difficult Not difficult at all Not difficult at all     Data saved with a previous flowsheet row definition       05/19/2024   10:18 AM 12/03/2023    3:45 PM 06/05/2023   11:05 AM  PHQ9 SCORE ONLY  PHQ-9 Total Score 3 0  0      Data saved with a previous flowsheet row definition   The patient comes in today for a wellness visit.    A review of their health history was completed.  A review of medications was also completed.  Any needed refills; will review medicines today  Eating habits: Overall healthy eating habits  Falls/  MVA accidents in past few months: No accidents recently  Regular exercise:  regular exercise  Specialist pt sees on regular basis: Eye specialist  Preventative health issues were discussed.   Additional concerns: Blood pressure, stress, eyes  Results for orders placed or performed in visit on 04/05/24  Basic metabolic panel with GFR   Collection Time: 04/26/24 10:10 AM  Result Value Ref Range   Glucose 91 70 - 99 mg/dL   BUN 16 8 - 27 mg/dL   Creatinine, Ser 9.13 0.76 - 1.27 mg/dL   eGFR 97 >40 fO/fpw/8.26   BUN/Creatinine Ratio 19 10 - 24    Sodium 140 134 - 144 mmol/L   Potassium 4.2 3.5 - 5.2 mmol/L   Chloride 104 96 - 106 mmol/L   CO2 24 20 - 29 mmol/L   Calcium  8.9 8.6 - 10.2 mg/dL  Hepatic function panel   Collection Time: 04/26/24 10:10 AM  Result Value Ref Range   Total Protein 6.8 6.0 - 8.5 g/dL   Albumin 4.5 3.9 - 4.9 g/dL   Bilirubin Total 0.5 0.0 - 1.2 mg/dL   Bilirubin, Direct 9.83 0.00 - 0.40 mg/dL   Alkaline Phosphatase 59 47 - 123 IU/L   AST 25 0 - 40 IU/L   ALT 25 0 - 44 IU/L  Lipid Panel   Collection Time: 04/26/24 10:10 AM  Result Value Ref Range   Cholesterol, Total 150 100 - 199 mg/dL   Triglycerides 887 0 - 149 mg/dL   HDL 55 >60 mg/dL   VLDL Cholesterol Cal 20 5 - 40 mg/dL   LDL Chol Calc (NIH) 75 0 - 99 mg/dL   Chol/HDL Ratio 2.7 0.0 - 5.0  ratio  Microalbumin/Creatinine Ratio, Urine   Collection Time: 04/26/24 10:10 AM  Result Value Ref Range   Creatinine, Urine 59.2 Not Estab. mg/dL   Microalbumin, Urine <6.9 Not Estab. ug/mL   Microalb/Creat Ratio <5 0 - 29 mg/g creat  CBC with Differential   Collection Time: 04/26/24 10:10 AM  Result Value Ref Range   WBC 6.1 3.4 - 10.8 x10E3/uL   RBC 4.77 4.14 - 5.80 x10E6/uL   Hemoglobin 15.2 13.0 - 17.7 g/dL   Hematocrit 55.1 62.4 - 51.0 %   MCV 94 79 - 97 fL   MCH 31.9 26.6 - 33.0 pg   MCHC 33.9 31.5 - 35.7 g/dL   RDW 87.9 88.3 - 84.5 %   Platelets 239 150 - 450 x10E3/uL   Neutrophils 63 Not Estab. %   Lymphs 28 Not Estab. %   Monocytes 7 Not Estab. %   Eos 1 Not Estab. %   Basos 1 Not Estab. %   Neutrophils Absolute 3.8 1.4 - 7.0 x10E3/uL   Lymphocytes Absolute 1.7 0.7 - 3.1 x10E3/uL   Monocytes Absolute 0.4 0.1 - 0.9 x10E3/uL   EOS (ABSOLUTE) 0.1 0.0 - 0.4 x10E3/uL   Basophils Absolute 0.0 0.0 - 0.2 x10E3/uL   Immature Granulocytes 0 Not Estab. %   Immature Grans (Abs) 0.0 0.0 - 0.1 x10E3/uL  PSA   Collection Time: 04/26/24 10:10 AM  Result Value Ref Range   Prostate Specific Ag, Serum 1.3 0.0 - 4.0 ng/mL   Lab work was  reviewed in detail Review of Systems     Objective:   Physical Exam  General-in no acute distress Eyes-no discharge Lungs-respiratory rate normal, CTA CV-no murmurs,RRR Extremities skin warm dry no edema Neuro grossly normal Behavior normal, alert Abdomen is soft Prostate exam normal       Assessment & Plan:  1. Well adult exam (Primary) Adult wellness-complete.wellness physical was conducted today. Importance of diet and exercise were discussed in detail.  Importance of stress reduction and healthy living were discussed.  In addition to this a discussion regarding safety was also covered.  We also reviewed over immunizations and gave recommendations regarding current immunization needed for age.   In addition to this additional areas were also touched on including: Preventative health exams needed:  Colonoscopy 2029  Patient was advised yearly wellness exam   2. Essential hypertension, benign Blood pressure fair control I checked his blood pressure here along with his cuff he tends to get stressed when he gets his blood pressure check.  His blood pressure cuff is accurate It should be noted that his blood pressure readings outside the office are typically in the upper 130s over mid 80s He has a history of central vein retinal occlusion given that I would like to try to get his blood pressure in the range of 120s to low 130s over 70s-low 80s He will send us  blood pressure readings Bump up dose of valsartan  to 80 mg Check metabolic 7 in about 10 days Send us  readings within 2 weeks  3. Mixed hyperlipidemia Keep cholesterol in good control continue current medication  Stress related issues-his biggest issue that he gets stressed about is his blood pressure but to some degree he also gets stressed about his wife's health issues We talked about ways to deal with that.  Given his GAD score and our discussion I would not recommend starting medications just yet but if this  becomes more pervasive to consider medication In addition to this I have  encouraged him to back down on the blood pressure frequency and send us  readings periodically   "

## 2024-06-07 ENCOUNTER — Encounter: Admitting: Family Medicine

## 2024-10-20 ENCOUNTER — Ambulatory Visit: Admitting: Family Medicine
